# Patient Record
Sex: Male | Born: 1975 | Race: Black or African American | Hispanic: No | Marital: Single | Smoking: Current every day smoker
Health system: Southern US, Community
[De-identification: ages and names within clinical notes are randomized; demographics above are authoritative.]

## PROBLEM LIST (undated history)

## (undated) DIAGNOSIS — B20 Human immunodeficiency virus [HIV] disease: Secondary | ICD-10-CM

---

## 1997-05-05 ENCOUNTER — Encounter: Admission: RE | Admit: 1997-05-05 | Discharge: 1997-05-05 | Payer: Self-pay | Admitting: Internal Medicine

## 1997-05-19 ENCOUNTER — Encounter: Admission: RE | Admit: 1997-05-19 | Discharge: 1997-05-19 | Payer: Self-pay | Admitting: Internal Medicine

## 1997-06-30 ENCOUNTER — Encounter: Admission: RE | Admit: 1997-06-30 | Discharge: 1997-06-30 | Payer: Self-pay | Admitting: *Deleted

## 1997-09-07 ENCOUNTER — Encounter: Admission: RE | Admit: 1997-09-07 | Discharge: 1997-09-07 | Payer: Self-pay | Admitting: Internal Medicine

## 1998-03-01 ENCOUNTER — Ambulatory Visit (HOSPITAL_COMMUNITY): Admission: RE | Admit: 1998-03-01 | Discharge: 1998-03-01 | Payer: Self-pay | Admitting: Internal Medicine

## 1998-03-01 ENCOUNTER — Encounter: Admission: RE | Admit: 1998-03-01 | Discharge: 1998-03-01 | Payer: Self-pay | Admitting: Internal Medicine

## 1999-01-09 ENCOUNTER — Ambulatory Visit (HOSPITAL_COMMUNITY): Admission: RE | Admit: 1999-01-09 | Discharge: 1999-01-09 | Payer: Self-pay | Admitting: Hematology and Oncology

## 1999-09-17 ENCOUNTER — Ambulatory Visit (HOSPITAL_COMMUNITY): Admission: RE | Admit: 1999-09-17 | Discharge: 1999-09-17 | Payer: Self-pay | Admitting: Internal Medicine

## 1999-09-17 ENCOUNTER — Encounter: Admission: RE | Admit: 1999-09-17 | Discharge: 1999-09-17 | Payer: Self-pay | Admitting: Internal Medicine

## 2000-03-17 ENCOUNTER — Encounter: Admission: RE | Admit: 2000-03-17 | Discharge: 2000-03-17 | Payer: Self-pay | Admitting: Internal Medicine

## 2000-03-17 ENCOUNTER — Ambulatory Visit (HOSPITAL_COMMUNITY): Admission: RE | Admit: 2000-03-17 | Discharge: 2000-03-17 | Payer: Self-pay | Admitting: Internal Medicine

## 2000-07-31 ENCOUNTER — Encounter: Admission: RE | Admit: 2000-07-31 | Discharge: 2000-07-31 | Payer: Self-pay | Admitting: Internal Medicine

## 2000-11-05 ENCOUNTER — Ambulatory Visit (HOSPITAL_COMMUNITY): Admission: RE | Admit: 2000-11-05 | Discharge: 2000-11-05 | Payer: Self-pay

## 2000-11-05 ENCOUNTER — Encounter: Admission: RE | Admit: 2000-11-05 | Discharge: 2000-11-05 | Payer: Self-pay

## 2001-06-04 ENCOUNTER — Ambulatory Visit (HOSPITAL_COMMUNITY): Admission: RE | Admit: 2001-06-04 | Discharge: 2001-06-04 | Payer: Self-pay | Admitting: Internal Medicine

## 2001-06-04 ENCOUNTER — Encounter: Admission: RE | Admit: 2001-06-04 | Discharge: 2001-06-04 | Payer: Self-pay | Admitting: Internal Medicine

## 2001-07-08 ENCOUNTER — Encounter: Admission: RE | Admit: 2001-07-08 | Discharge: 2001-07-08 | Payer: Self-pay | Admitting: Internal Medicine

## 2001-08-24 ENCOUNTER — Encounter: Admission: RE | Admit: 2001-08-24 | Discharge: 2001-08-24 | Payer: Self-pay | Admitting: Internal Medicine

## 2001-09-28 ENCOUNTER — Encounter: Admission: RE | Admit: 2001-09-28 | Discharge: 2001-09-28 | Payer: Self-pay | Admitting: Internal Medicine

## 2001-11-03 ENCOUNTER — Encounter: Admission: RE | Admit: 2001-11-03 | Discharge: 2001-11-03 | Payer: Self-pay | Admitting: Internal Medicine

## 2001-11-03 ENCOUNTER — Ambulatory Visit (HOSPITAL_COMMUNITY): Admission: RE | Admit: 2001-11-03 | Discharge: 2001-11-03 | Payer: Self-pay | Admitting: Internal Medicine

## 2001-12-14 ENCOUNTER — Encounter: Admission: RE | Admit: 2001-12-14 | Discharge: 2001-12-14 | Payer: Self-pay | Admitting: Internal Medicine

## 2001-12-17 ENCOUNTER — Encounter: Payer: Self-pay | Admitting: Internal Medicine

## 2001-12-17 ENCOUNTER — Encounter: Admission: RE | Admit: 2001-12-17 | Discharge: 2001-12-17 | Payer: Self-pay | Admitting: Internal Medicine

## 2001-12-17 ENCOUNTER — Ambulatory Visit (HOSPITAL_COMMUNITY): Admission: RE | Admit: 2001-12-17 | Discharge: 2001-12-17 | Payer: Self-pay | Admitting: Internal Medicine

## 2003-05-19 ENCOUNTER — Encounter: Admission: RE | Admit: 2003-05-19 | Discharge: 2003-05-19 | Payer: Self-pay | Admitting: Internal Medicine

## 2003-08-15 ENCOUNTER — Encounter: Admission: RE | Admit: 2003-08-15 | Discharge: 2003-08-15 | Payer: Self-pay | Admitting: Internal Medicine

## 2003-08-15 ENCOUNTER — Ambulatory Visit (HOSPITAL_COMMUNITY): Admission: RE | Admit: 2003-08-15 | Discharge: 2003-08-15 | Payer: Self-pay | Admitting: Internal Medicine

## 2003-11-03 ENCOUNTER — Ambulatory Visit: Payer: Self-pay | Admitting: Internal Medicine

## 2004-02-07 ENCOUNTER — Ambulatory Visit: Payer: Self-pay | Admitting: Internal Medicine

## 2005-05-08 ENCOUNTER — Emergency Department (HOSPITAL_COMMUNITY): Admission: EM | Admit: 2005-05-08 | Discharge: 2005-05-08 | Payer: Self-pay | Admitting: Emergency Medicine

## 2005-06-19 ENCOUNTER — Emergency Department (HOSPITAL_COMMUNITY): Admission: EM | Admit: 2005-06-19 | Discharge: 2005-06-20 | Payer: Self-pay | Admitting: Emergency Medicine

## 2009-01-31 ENCOUNTER — Ambulatory Visit: Payer: Self-pay | Admitting: Internal Medicine

## 2009-01-31 ENCOUNTER — Encounter: Payer: Self-pay | Admitting: Internal Medicine

## 2009-03-01 ENCOUNTER — Ambulatory Visit: Payer: Self-pay | Admitting: Internal Medicine

## 2009-03-02 ENCOUNTER — Encounter (INDEPENDENT_AMBULATORY_CARE_PROVIDER_SITE_OTHER): Payer: Self-pay | Admitting: Internal Medicine

## 2009-03-02 LAB — CONVERTED CEMR LAB
HIV 1 RNA Quant: <48 copies/mL (ref <48)
HIV-1 RNA Quant, Log: <1.68 (ref <1.68)

## 2009-03-06 ENCOUNTER — Ambulatory Visit: Payer: Self-pay | Admitting: Internal Medicine

## 2009-03-06 ENCOUNTER — Encounter: Payer: Self-pay | Admitting: Internal Medicine

## 2009-03-09 ENCOUNTER — Encounter: Payer: Self-pay | Admitting: Internal Medicine

## 2009-03-14 ENCOUNTER — Encounter: Payer: Self-pay | Admitting: Internal Medicine

## 2009-03-15 ENCOUNTER — Encounter: Payer: Self-pay | Admitting: Internal Medicine

## 2009-03-15 ENCOUNTER — Ambulatory Visit: Payer: Self-pay | Admitting: Internal Medicine

## 2009-03-15 DIAGNOSIS — B20 Human immunodeficiency virus [HIV] disease: Secondary | ICD-10-CM

## 2009-03-16 LAB — CONVERTED CEMR LAB
ALT: 48 units/L (ref 0–53)
AST: 45 units/L — ABNORMAL HIGH (ref 0–37)
Absolute CD4: 465 #/uL (ref 381–1469)
Albumin: 4.7 g/dL (ref 3.5–5.2)
Alkaline Phosphatase: 121 units/L — ABNORMAL HIGH (ref 39–117)
BUN: 17 mg/dL (ref 6–23)
Basophils Absolute: 0 10*3/uL (ref 0.0–0.1)
Basophils Relative: 0 % (ref 0–1)
CD4 T Helper %: 31 % — ABNORMAL LOW (ref 32–62)
CO2: 23 meq/L (ref 19–32)
Calcium: 9.8 mg/dL (ref 8.4–10.5)
Chlamydia, Swab/Urine, PCR: NEGATIVE
Chloride: 104 meq/L (ref 96–112)
Creatinine, Ser: 1.01 mg/dL (ref 0.40–1.50)
Eosinophils Absolute: 0.1 10*3/uL (ref 0.0–0.7)
Eosinophils Relative: 3 % (ref 0–5)
GC Probe Amp, Urine: NEGATIVE
Glucose, Bld: 99 mg/dL (ref 70–99)
HCT: 44.4 % (ref 39.0–52.0)
HCV Ab: NEGATIVE
Hemoglobin: 15.1 g/dL (ref 13.0–17.0)
Hep A Total Ab: POSITIVE — AB
Hep B Core Total Ab: NEGATIVE
Hep B S Ab: POSITIVE — AB
Lymphocytes Relative: 30 % (ref 12–46)
Lymphs Abs: 1.5 10*3/uL (ref 0.7–4.0)
MCHC: 34 g/dL (ref 30.0–36.0)
MCV: 92.9 fL (ref 78.0–100.0)
Monocytes Absolute: 0.5 10*3/uL (ref 0.1–1.0)
Monocytes Relative: 10 % (ref 3–12)
Neutro Abs: 2.8 10*3/uL (ref 1.7–7.7)
Neutrophils Relative %: 57 % (ref 43–77)
Platelets: 232 10*3/uL (ref 150–400)
Potassium: 4.5 meq/L (ref 3.5–5.3)
RBC: 4.78 M/uL (ref 4.22–5.81)
RDW: 13.7 % (ref 11.5–15.5)
Sodium: 137 meq/L (ref 135–145)
Total Bilirubin: 0.5 mg/dL (ref 0.3–1.2)
Total Protein: 7.9 g/dL (ref 6.0–8.3)
Total lymphocyte count: 1500 cells/mcL (ref 700–3300)
WBC: 5 10*3/uL (ref 4.0–10.5)

## 2009-07-09 ENCOUNTER — Ambulatory Visit: Payer: Self-pay | Admitting: Internal Medicine

## 2009-07-09 LAB — CONVERTED CEMR LAB
ALT: 74 units/L — ABNORMAL HIGH (ref 0–53)
AST: 52 units/L — ABNORMAL HIGH (ref 0–37)
Albumin: 4.9 g/dL (ref 3.5–5.2)
Alkaline Phosphatase: 113 units/L (ref 39–117)
BUN: 18 mg/dL (ref 6–23)
Basophils Absolute: 0 10*3/uL (ref 0.0–0.1)
Basophils Relative: 0 % (ref 0–1)
CO2: 19 meq/L (ref 19–32)
Calcium: 9.9 mg/dL (ref 8.4–10.5)
Chloride: 107 meq/L (ref 96–112)
Creatinine, Ser: 1.08 mg/dL (ref 0.40–1.50)
Eosinophils Absolute: 0.1 10*3/uL (ref 0.0–0.7)
Eosinophils Relative: 2 % (ref 0–5)
Glucose, Bld: 111 mg/dL — ABNORMAL HIGH (ref 70–99)
HCT: 42.5 % (ref 39.0–52.0)
HIV 1 RNA Quant: <48 copies/mL (ref <48)
HIV-1 RNA Quant, Log: <1.68 (ref <1.68)
Hemoglobin: 15 g/dL (ref 13.0–17.0)
Lymphocytes Relative: 35 % (ref 12–46)
Lymphs Abs: 1.4 10*3/uL (ref 0.7–4.0)
MCHC: 35.3 g/dL (ref 30.0–36.0)
MCV: 93 fL (ref 78.0–100.0)
Monocytes Absolute: 0.4 10*3/uL (ref 0.1–1.0)
Monocytes Relative: 10 % (ref 3–12)
Neutro Abs: 2.1 10*3/uL (ref 1.7–7.7)
Neutrophils Relative %: 53 % (ref 43–77)
Platelets: 241 10*3/uL (ref 150–400)
Potassium: 4.3 meq/L (ref 3.5–5.3)
RBC: 4.57 M/uL (ref 4.22–5.81)
RDW: 12.9 % (ref 11.5–15.5)
Sodium: 141 meq/L (ref 135–145)
Total Bilirubin: 0.4 mg/dL (ref 0.3–1.2)
Total Protein: 8 g/dL (ref 6.0–8.3)
WBC: 4 10*3/uL (ref 4.0–10.5)

## 2009-07-11 ENCOUNTER — Encounter: Payer: Self-pay | Admitting: Internal Medicine

## 2009-07-26 ENCOUNTER — Encounter (INDEPENDENT_AMBULATORY_CARE_PROVIDER_SITE_OTHER): Payer: Self-pay | Admitting: *Deleted

## 2009-07-26 ENCOUNTER — Ambulatory Visit: Payer: Self-pay | Admitting: Internal Medicine

## 2009-08-21 ENCOUNTER — Encounter: Payer: Self-pay | Admitting: Internal Medicine

## 2009-08-21 ENCOUNTER — Emergency Department (HOSPITAL_COMMUNITY): Admission: EM | Admit: 2009-08-21 | Discharge: 2009-08-21 | Payer: Self-pay | Admitting: Family Medicine

## 2009-10-22 ENCOUNTER — Ambulatory Visit: Payer: Self-pay | Admitting: Internal Medicine

## 2009-10-22 LAB — CONVERTED CEMR LAB
ALT: 46 units/L (ref 0–53)
AST: 54 units/L — ABNORMAL HIGH (ref 0–37)
Albumin: 4.4 g/dL (ref 3.5–5.2)
Alkaline Phosphatase: 133 units/L — ABNORMAL HIGH (ref 39–117)
BUN: 18 mg/dL (ref 6–23)
Basophils Absolute: 0 10*3/uL (ref 0.0–0.1)
Basophils Relative: 0 % (ref 0–1)
CO2: 21 meq/L (ref 19–32)
Calcium: 9.6 mg/dL (ref 8.4–10.5)
Chloride: 106 meq/L (ref 96–112)
Cholesterol: 158 mg/dL (ref 0–200)
Creatinine, Ser: 0.97 mg/dL (ref 0.40–1.50)
Eosinophils Absolute: 0.1 10*3/uL (ref 0.0–0.7)
Eosinophils Relative: 3 % (ref 0–5)
Glucose, Bld: 100 mg/dL — ABNORMAL HIGH (ref 70–99)
HCT: 42.7 % (ref 39.0–52.0)
HDL: 45 mg/dL (ref >39)
HIV 1 RNA Quant: <20 copies/mL (ref <20)
HIV-1 RNA Quant, Log: <1.3 (ref <1.30)
Hemoglobin: 14.5 g/dL (ref 13.0–17.0)
LDL Cholesterol: 74 mg/dL (ref 0–99)
Lymphocytes Relative: 44 % (ref 12–46)
Lymphs Abs: 1.7 10*3/uL (ref 0.7–4.0)
MCHC: 34 g/dL (ref 30.0–36.0)
MCV: 96 fL (ref 78.0–100.0)
Monocytes Absolute: 0.4 10*3/uL (ref 0.1–1.0)
Monocytes Relative: 10 % (ref 3–12)
Neutro Abs: 1.6 10*3/uL — ABNORMAL LOW (ref 1.7–7.7)
Neutrophils Relative %: 43 % (ref 43–77)
Platelets: 283 10*3/uL (ref 150–400)
Potassium: 4.4 meq/L (ref 3.5–5.3)
RBC: 4.45 M/uL (ref 4.22–5.81)
RDW: 13.3 % (ref 11.5–15.5)
Sodium: 136 meq/L (ref 135–145)
Total Bilirubin: 0.3 mg/dL (ref 0.3–1.2)
Total CHOL/HDL Ratio: 3.5
Total Protein: 7.3 g/dL (ref 6.0–8.3)
Triglycerides: 197 mg/dL — ABNORMAL HIGH (ref <150)
VLDL: 39 mg/dL (ref 0–40)
WBC: 3.8 10*3/uL — ABNORMAL LOW (ref 4.0–10.5)

## 2009-11-07 ENCOUNTER — Ambulatory Visit: Payer: Self-pay | Admitting: Internal Medicine

## 2009-11-07 DIAGNOSIS — M25569 Pain in unspecified knee: Secondary | ICD-10-CM

## 2010-02-12 NOTE — Miscellaneous (Signed)
Summary: HIPAA Restrictions  HIPAA Restrictions   Imported By: Florinda Marker 07/11/2009 11:21:04  _____________________________________________________________________  External Attachment:    Type:   Image     Comment:   External Document

## 2010-02-12 NOTE — Miscellaneous (Signed)
Summary: RW Update  Clinical Lists Changes  Observations: Added new observation of RWPARTICIP: Yes (08/21/2009 13:26)

## 2010-02-12 NOTE — Letter (Signed)
Summary: Past Medical Records  Past Medical Records   Imported By: Percell Miller 03/09/2009 15:10:26  _____________________________________________________________________  External Attachment:    Type:   Image     Comment:   External Document

## 2010-02-12 NOTE — Miscellaneous (Signed)
Summary: intake  Clinical Lists Changes  Medications: Added new medication of * ATRIPLA 600-300-200 MG Take one tablet by mouth daily. Observations: Added new observation of TOXO_PMH_YR: No (01/31/2009 15:23) Added new observation of PCP_PMH_YEAR: No (01/31/2009 15:23) Added new observation of MAC_PMH_YEAR: No (01/31/2009 15:23) Added new observation of HERPES_POS: No (01/31/2009 15:23) Added new observation of CMV_PMH_YEAR: No (01/31/2009 15:23) Added new observation of DRUG USE: No (01/31/2009 15:23) Added new observation of ALCOHOL USE: 2x week (01/31/2009 15:23) Added new observation of ALCOHOL COMM: Yes (01/31/2009 15:23) Added new observation of SMOK HX PPD: 1/2 (01/31/2009 15:23) Added new observation of PAS CIG SMOK: Yes (01/31/2009 15:23) Added new observation of TOBACCO USE: current (01/31/2009 15:23) Added new observation of RWTITLE: B (01/31/2009 15:23) Added new observation of PAYOR: Unknown (01/31/2009 15:23) Added new observation of AIDSDAP: No (01/31/2009 15:23) Added new observation of HOUSEINCOME: 0  (01/31/2009 15:23) Added new observation of HOUSING: Temporary  (01/31/2009 15:23) Added new observation of #CHILD<18 IN: No  (01/31/2009 15:23) Added new observation of FAMILYSIZE: 1  (01/31/2009 15:23) Added new observation of YEARLYEXPEN: 0  (01/31/2009 15:23) Added new observation of HIV RISK BEH: Heterosexual contact  (01/31/2009 15:23) Added new observation of MARITAL STAT: Single  (01/31/2009 15:23) Added new observation of INFECTDIS MD: Philipp Deputy  (01/31/2009 15:23) Added new observation of HIV INIT DX: 1996  (01/31/2009 15:23) Added new observation of GENDER: Male  (01/31/2009 15:23) Added new observation of LATINO/HISP: No  (01/31/2009 15:23) Added new observation of RACE: African American  (01/31/2009 15:23) Added new observation of REC_MESSAGE: Yes  (01/31/2009 15:23) Added new observation of RECPHONECALL: Yes  (01/31/2009 15:23) Added new observation of  REC_MAIL: Yes  (01/31/2009 15:23) Added new observation of RW VITAL STA: Active  (01/31/2009 15:23) Added new observation of PATNTCOUNTY: [place]  (01/31/2009 15:23) Added new observation of RWPARTICIP: Yes - [facility]  (01/31/2009 15:23) Added new observation of FLU VAX: Historical  (01/01/2009 16:44) Added new observation of HEPBVAX#2: Historical  (10/10/2008 16:44) Added new observation of HEPBVAX#1: Historical  (09/01/2008 16:44) Added new observation of PNEUMOVAX: Historical  (08/09/2008 16:44) Added new observation of TD BOOSTER: Historical  (06/29/2008 16:44)       Infectious Disease New Patient Intake Referring MD: [facility]  Return Appointment With Physician: Dr.Willford Rabideau Medical Records: Received Health Insurance / Payor: Unknown Employer: none      Do you have a Primary physician: No Are family members aware of patient's diagnosis?  If so, are they supportive? mother aware and supportive Describe patient's current social support (family, friends, support groups): has a girlfrend of 18 years  Medical History Medical Problems OTHER than HIV: Yes  Medication Allergies: No   Medications: Yes   Current Meds:  * ATRIPLA 600-300-200 MG Take one tablet by mouth daily.   Family History Heart Disease: Yes  Comments: mother MI, stent Hypertension: Yes  Comments: mother Diabetes: No High Cholesterol: No Kidney Disease: No Cancer: Yes  Comments: mother lung Cancer Thyroid Disease: No  Tobacco use: current Amt: 1/2 packs per day. Passive cigarette smoke exposure: Yes  Behavioral Health Assessment Have you ever been diagnosed with depression or mental illness? No  Do you drink alcohol? Yes Last Date of Consumption: 01/30/2009 Frequency: 2x week Alcohol Beverage Type(s):  beer 40 oz. Do you use recreational drugs? 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 No   Facility Name 1: [facility]    City/State: [place],  [facility] Comments: 3 at [facility] for court ordered tx for cocaine  HIV Intake Information When did you first test positive for HIV? 1996 Where was this test performed?  Name of Agency: [facility]  City/State: [place], [place]  [place]: [place] Was this your first time ever being tested or HIV? Yes Risk Factor(s) for HIV: Heterosexual contact  Method of Exposure to HIV: Heterosexual Intercourse Have you ever been hospitalized for any HIV-related condition? No  Have you ever been under the care of a physician for being HIV positive? Yes Name of Physician 1: [facility]  City/State: [place], [place]  HIV Medications Information  Nucleoside Reverse Transcriptase Inhibitors (NRTI's) Combivir (Lamivadin 150mg /Zidovudine 300mg ): Yes   Last Date of Use:  2003 or 2004  Non-Nucleoside Reverse Transcriptase Inhibitors (NNRTI's) Sustiva (Efavirenz): Yes   Last Date of Use:  2003 or 2004  Infection History  Patient has been diagnosed with the following opportunistic infections: Candidiasis (thrush) No Herpes No Mycobacterium avium (MAI or MAC) No Cervical Cancer No Histoplasmosis No Pneumocystis carinii pneumonia (PCP) No Coccidiodomycosis No Isosporiasis No Progressive Multifocal Leukoencephalophthy (PML) No Cryptococcosis No Kaposi's Sarcoma No Pneumonia, Recurrent No Crytposporidiosis No Lymphoma No Salmonella Septicemia, Recurrent No Cytomegalovirus (CMV) No Tuberculosis (TB) No Toxoplasmosis No Encephalopathy No Wasting Syndrome No Are there any other symptoms you need to discuss? No Have you received literature/education prior to this visit about HIV/AIDS? Yes Do you understand the meaning of a Viral Load? Yes Do you understand the meaning of a CD4 count? Yes Lab Values Education/Handout Given No Medication Education/Handout Given No  Sexual History Are you in a current relationship? Yes How  long have you been in this relationship? 18 years Are they aware of your diagnosis? Yes Have they been tested for HIV? Yes What were the results: Negative Are you currently sexually active? Yes Was this protected intercourse? Yes  Evaluation and Follow-Up  Juanell Fairly Consent: Yes ADAP: No Are you in need of condoms at this time? No Our patient has been informed that condoms are always available in this clinic.   Brochure Provided for Above Organizations? Yes Name of Agency: [facility] Does patient have problems that warrant Social Worker referral? No    Hepatitis B Immunization History:    Hep B # 1:  Historical (09/01/2008)    Hep B # 2:  Historical (10/10/2008)  Tetanus/Td Immunization History:    Tetanus/Td # 1:  Historical (06/29/2008)  Influenza Immunization History:    Influenza # 1:  Historical (01/01/2009)  Pneumovax Immunization History:    Pneumovax # 1:  Historical (08/09/2008)

## 2010-02-12 NOTE — Miscellaneous (Signed)
Summary: Orders Update  Clinical Lists Changes  Orders: Added new Test order of T-CBC w/Diff [phone]) - Signed Added new Test order of T-CD4SP [facility]) (CD4SP) - Signed Added new Test order of T-Comprehensive Metabolic Panel [phone]) - Signed Added new Test order of T-HIV Viral Load [phone]) - Signed     Process Orders Check Orders Results:     [facility]: ABN not required for this insurance Tests Sent for requisitioning (July 09, 2009 2:46 PM):     07/09/2009: [facility] -- T-CBC w/Diff [[id number]] (signed)     07/09/2009: [facility] -- T-Comprehensive Metabolic Panel [[id number]] (signed)     07/09/2009: [facility] -- T-HIV Viral Load [phone] (signed)

## 2010-02-12 NOTE — Miscellaneous (Signed)
Summary: clinical update/ryan white  Clinical Lists Changes  Observations: Added new observation of AIDSDAP: Yes 2011 (07/26/2009 15:18) Added new observation of HOUSING: Unstable (07/26/2009 15:18) Added new observation of INCOMESOURCE: NONE (07/26/2009 15:18) Added new observation of FINASSESSDT: 07/26/2009 (07/26/2009 15:18)

## 2010-02-12 NOTE — Letter (Signed)
Summary: VERIFICATION OF DISABILTY/ HANDICAP  VERIFICATION OF DISABILTY/ HANDICAP   Imported By: Margie Billet 04/05/2009 11:36:45  _____________________________________________________________________  External Attachment:    Type:   Image     Comment:   External Document

## 2010-02-12 NOTE — Assessment & Plan Note (Signed)
Summary: F/U [MKJ]   CC:  follow-up visit and lab results.  History of Present Illness: Pt here for f/u.  He finally received his food stamps and is going to get housing. He missed o 1 or 2 doses of his Atripla.  He has been feeling well.  Preventive Screening-Counseling & Management  Alcohol-Tobacco     Alcohol drinks/day: 2x week     Smoking Status: current     Packs/Day: 1.0     Passive Smoke Exposure: Yes  Caffeine-Diet-Exercise     Caffeine use/day: 0     Does Patient Exercise: no  Safety-Violence-Falls     Seat Belt Use: yes      Sexual History:  currently monogamous.        Drug Use:  No.    Comments: pt. declined condoms   Updated Prior Medication List: ATRIPLA 600-200-300 MG TABS (EFAVIRENZ-EMTRICITAB-TENOFOVIR)   Current Allergies (reviewed today): No known allergies  Past History:  Past Medical History: Last updated: 03/15/2009 HIV disease  Social History: Sexual History:  currently monogamous  Review of Systems  The patient denies anorexia, fever, and weight loss.    Vital Signs:  Patient profile:   35 year old male Height:      67 inches (170.18 cm) Weight:      176.8 pounds (80.36 kg) BMI:     27.79 Temp:     97.8 degrees F (36.56 degrees C) oral Pulse rate:   92 / minute BP sitting:   138 / 91  (left arm)  Vitals Entered By: Wendall Mola CMA Duncan Dull) (July 26, 2009 2:31 PM) CC: follow-up visit, lab results Is Patient Diabetic? No Pain Assessment Patient in pain? no      Nutritional Status BMI of 25 - 29 = overweight Nutritional Status Detail appetite "good"  Have you ever been in a relationship where you felt threatened, hurt or afraid?No   Does patient need assistance? Functional Status Self care Ambulation Normal Comments no missed doses of meds per patient   Physical Exam  General:  alert, well-developed, well-nourished, and well-hydrated.   Head:  normocephalic and atraumatic.   Mouth:  pharynx pink and moist.   no thrush  Lungs:  normal breath sounds.      Impression & Recommendations:  Problem # 1:  HIV DISEASE (ICD-042) Pt.s most recent CD4ct was 460 and VL <48 .  Pt instructed to continue the current antiretroviral regimen.  Pt encouraged to take medication regularly and not miss doses.  Pt will f/u in 3 months for repeat blood work and will see me 2 weeks later.  Diagnostics Reviewed:  HIV: HIV positive - AIDS status unknown (03/15/2009)   CD4: 460 (07/10/2009)   WBC: 4.0 (07/09/2009)   Hgb: 15.0 (07/09/2009)   HCT: 42.5 (07/09/2009)   Platelets: 241 (07/09/2009) HIV-1 RNA: <48 copies/mL (07/09/2009)     Other Orders: Est. Patient Level III (16109) Future Orders: T-CD4SP ([facility]) (CD4SP) ... 10/24/2009 T-HIV Viral Load [phone]) ... 10/24/2009 T-Comprehensive Metabolic Panel [id number]) ... 10/24/2009 T-CBC w/Diff ([id number]) ... 10/24/2009 T-Lipid Profile [id number]) ... 10/24/2009  Patient Instructions: 1)  Please schedule a follow-up appointment in 3 months, 2 weeks after labs.

## 2010-02-12 NOTE — Miscellaneous (Signed)
Summary: Office Visit ([facility])    Vital Signs:  Patient profile:   35 year old male Height:      67 inches Weight:      179.1 pounds BMI:     28.15 Temp:     98.5 degrees F oral Pulse rate:   91 / minute Pulse rhythm:   regular Resp:     18 per minute BP sitting:   113 / 89  (left arm) Cuff size:   regular  Vitals Entered By: Michelle Nasuti (March 15, 2009 11:02 AM) CC: new 05 visit establish care Is Patient Diabetic? No Pain Assessment Patient in pain? no       Does patient need assistance? Functional Status Self care Ambulation Normal   CC:  new 05 visit establish care.  History of Present Illness: Pt doing well.  He was recently released from prison. He is currently taking Atripla without any problems. His last CD4ct in prison was in the 300s.  Preventive Screening-Counseling & Management  Alcohol-Tobacco     Smoking Status: current     Packs/Day: 0.5  Current Problems (verified): 1)  HIV Disease  (ICD-042)  Current Medications (verified): 1)  Atripla 600-200-300 Mg Tabs (Efavirenz-Emtricitab-Tenofovir)  Allergies (verified): No Known Drug Allergies  Past History:  Past Medical History: HIV disease   Review of Systems  The patient denies anorexia, fever, and weight loss.     Physical Exam  General:  alert, well-developed, well-nourished, and well-hydrated.   Head:  normocephalic and atraumatic.   Mouth:  pharynx pink and moist.   Lungs:  normal breath sounds.          Medication Adherence: 03/15/2009   Adherence to medications reviewed with patient. Counseling to provide adequate adherence provided   Prevention For Positives: 03/15/2009   Safe sex practices discussed with patient. Condoms offered.                             Impression & Recommendations:  Problem # 1:  HIV DISEASE (ICD-042)  Last CD4ct was 465 and VL <48.  He is to continue his Atripla and try not to miss any doses. Diagnostics Reviewed:  WBC: 5.0  (03/06/2009)   Hgb: 15.1 (03/06/2009)   HCT: 44.4 (03/06/2009)   Platelets: 232 (03/06/2009) HIV-1 RNA: <48 copies/mL (03/02/2009)     Orders: New Patient Level III (16109)  Medications Added to Medication List This Visit: 1)  Atripla 600-200-300 Mg Tabs (Efavirenz-emtricitab-tenofovir)   Patient Instructions: 1)  Please schedule a follow-up appointment in 3 months. Prescriptions: ATRIPLA 600-200-300 MG TABS (EFAVIRENZ-EMTRICITAB-TENOFOVIR)   #30 x 5   Entered and Authorized by:   Yisroel Ramming MD   Signed by:   Yisroel Ramming MD on 03/15/2009   Method used:   Print then Give to Patient   RxID:   [id number]

## 2010-02-12 NOTE — Assessment & Plan Note (Signed)
Summary: F/U/VS   CC:  follow-up visit, lab results, and c/o right knee pain.  History of Present Illness: Pt here for lab results. He c/o some right knee pain. He thinks it may be due to riding his bike.  He has not tried anything for the pain. No missed doses of his Atripla.  Preventive Screening-Counseling & Management  Alcohol-Tobacco     Alcohol drinks/day: 2x week     Smoking Status: current     Packs/Day: 1.0     Passive Smoke Exposure: Yes  Caffeine-Diet-Exercise     Caffeine use/day: 0     Does Patient Exercise: yes     Type of exercise: biking     Times/week: 7  Safety-Violence-Falls     Seat Belt Use: yes      Sexual History:  currently monogamous.        Drug Use:  former and cocaine.    Comments: pt. declined condoms   Updated Prior Medication List: ATRIPLA 600-200-300 MG TABS (EFAVIRENZ-EMTRICITAB-TENOFOVIR) 1 tablet daily by mouth  Current Allergies (reviewed today): No known allergies  Past History:  Past Medical History: Last updated: 03/15/2009 HIV disease  Social History: Drug Use:  former, cocaine  Review of Systems  The patient denies anorexia, fever, and weight loss.    Vital Signs:  Patient profile:   35 year old male Height:      67 inches (170.18 cm) Weight:      166.8 pounds (75.82 kg) BMI:     26.22 Temp:     98.5 degrees F (36.94 degrees C) oral Pulse rate:   60 / minute BP sitting:   121 / 78  (left arm)  Vitals Entered By: Wendall Mola CMA Duncan Dull) (November 07, 2009 3:09 PM) CC: follow-up visit, lab results, c/o right knee pain Is Patient Diabetic? No Pain Assessment Patient in pain? yes     Location: right knee Intensity: 8 Type: aching Onset of pain  Intermittent Nutritional Status BMI of 25 - 29 = overweight Nutritional Status Detail appetite "good"  Have you ever been in a relationship where you felt threatened, hurt or afraid?No   Does patient need assistance? Functional Status Self  care Ambulation Normal Comments pt. missed 2-3 doses of Atripla since last visit   Physical Exam  General:  alert, well-developed, well-nourished, and well-hydrated.   Head:  normocephalic and atraumatic.   Mouth:  pharynx pink and moist.   Lungs:  normal breath sounds.   Msk:  no joint tenderness, no joint swelling, no joint warmth, and no redness over joints.     Impression & Recommendations:  Problem # 1:  HIV DISEASE (ICD-042) Pt.s most recent CD4ct was 570 and VL <20 .  Pt instructed to continue the current antiretroviral regimen.  Pt encouraged to take medication regularly and not miss doses.  Pt will f/u in 3 months for repeat blood work and will see me 2 weeks later. Influenza vaccine given.  Diagnostics Reviewed:  HIV: HIV positive - AIDS status unknown (03/15/2009)   CD4: 570 (10/23/2009)   WBC: 3.8 (10/22/2009)   Hgb: 14.5 (10/22/2009)   HCT: 42.7 (10/22/2009)   Platelets: 283 (10/22/2009) HIV-1 RNA: <20 copies/mL (10/22/2009)     Problem # 2:  KNEE PAIN, RIGHT (ICD-719.46) ibuprfoen three times a day with food  Other Orders: Est. Patient Level III (16967) Influenza Vaccine NON MCR (89381) TB Skin Test (01751) Admin 1st Vaccine (02585) Future Orders: T-CD4SP ([facility]) (CD4SP) ... 02/05/2010 T-HIV  Viral Load [id number]) ... 02/05/2010 T-Comprehensive Metabolic Panel [phone]) ... 02/05/2010 T-CBC w/Diff ([id number]) ... 02/05/2010  Patient Instructions: 1)  Please schedule a follow-up appointment in 3 months, 2 weeks after labs.      Immunizations Administered:  Influenza Vaccine # 1:    Vaccine Type: Fluvax Non-MCR    Site: left deltoid    Mfr: Novartis    Dose: 0.5 ml    Route: IM    Given by: Wendall Mola CMA ( AAMA)    Exp. Date: 04/14/2010    Lot #: [id number]    VIS given: 08/07/09 version given November 07, 2009.  PPD Skin Test:    Vaccine Type: PPD    Site: left forearm    Mfr: Sanofi Pasteur    Dose: 0.1 ml    Route: ID     Given by: Wendall Mola CMA ( AAMA)    Exp. Date: 11/15/2010    Lot #: [id number]  Flu Vaccine Consent Questions:    Do you have a history of severe allergic reactions to this vaccine? no    Any prior history of allergic reactions to egg and/or gelatin? no    Do you have a sensitivity to the preservative Thimersol? no    Do you have a past history of Guillan-Barre Syndrome? no    Do you currently have an acute febrile illness? no    Have you ever had a severe reaction to latex? no    Vaccine information given and explained to patient? yes

## 2010-02-18 ENCOUNTER — Other Ambulatory Visit: Payer: Self-pay

## 2010-02-21 ENCOUNTER — Encounter (INDEPENDENT_AMBULATORY_CARE_PROVIDER_SITE_OTHER): Payer: Self-pay | Admitting: *Deleted

## 2010-02-28 ENCOUNTER — Telehealth (INDEPENDENT_AMBULATORY_CARE_PROVIDER_SITE_OTHER): Payer: Self-pay | Admitting: *Deleted

## 2010-02-28 ENCOUNTER — Other Ambulatory Visit: Payer: Self-pay | Admitting: Adult Health

## 2010-02-28 ENCOUNTER — Other Ambulatory Visit (INDEPENDENT_AMBULATORY_CARE_PROVIDER_SITE_OTHER): Payer: Self-pay

## 2010-02-28 ENCOUNTER — Encounter: Payer: Self-pay | Admitting: Adult Health

## 2010-02-28 DIAGNOSIS — B20 Human immunodeficiency virus [HIV] disease: Secondary | ICD-10-CM

## 2010-02-28 LAB — CONVERTED CEMR LAB
ALT: 25 U/L
AST: 31 U/L
Albumin: 4 g/dL
Alkaline Phosphatase: 111 U/L
BUN: 14 mg/dL
Basophils Absolute: 0 K/uL
Basophils Relative: 1 %
CO2: 24 meq/L
Calcium: 9.4 mg/dL
Chloride: 109 meq/L
Creatinine, Ser: 0.88 mg/dL
Eosinophils Absolute: 0.2 K/uL
Eosinophils Relative: 4 %
Glucose, Bld: 97 mg/dL
HCT: 41.5 %
HIV 1 RNA Quant: <20 copies/mL (ref <20)
HIV-1 RNA Quant, Log: <1.3 (ref <1.30)
Hemoglobin: 14.1 g/dL
Lymphocytes Relative: 45 %
Lymphs Abs: 1.9 K/uL
MCHC: 34 g/dL
MCV: 95.2 fL
Monocytes Absolute: 0.4 K/uL
Monocytes Relative: 9 %
Neutro Abs: 1.7 K/uL
Neutrophils Relative %: 42 % — ABNORMAL LOW
Platelets: 257 K/uL
Potassium: 4.5 meq/L
RBC: 4.36 M/uL
RDW: 12.9 %
Sodium: 141 meq/L
Total Bilirubin: 0.2 mg/dL — ABNORMAL LOW
Total Protein: 6.7 g/dL
WBC: 4.1 10*3/microliter

## 2010-02-28 NOTE — Miscellaneous (Signed)
  Clinical Lists Changes  Observations: Added new observation of PAYOR: No Insurance (02/21/2010 13:58)

## 2010-03-01 LAB — T-HELPER CELL (CD4) - (RCID CLINIC ONLY)
CD4 % Helper T Cell: 34 % (ref 33–55)
CD4 T Cell Abs: 630 uL (ref 400–2700)

## 2010-03-04 ENCOUNTER — Ambulatory Visit: Payer: Self-pay | Admitting: Adult Health

## 2010-03-05 ENCOUNTER — Encounter (INDEPENDENT_AMBULATORY_CARE_PROVIDER_SITE_OTHER): Payer: Self-pay | Admitting: *Deleted

## 2010-03-06 NOTE — Progress Notes (Signed)
Summary: ADAP re-enrollment submitted  Phone Note Call from Patient   Summary of Call: ADAP re-enrollment completed and submitted by Lonell Face on 02/25/2010

## 2010-03-06 NOTE — Progress Notes (Signed)
Summary: ADAP re-enrollment submitted  Phone Note Call from Patient   Summary of Call: ADAP re-enrollment completed and submitted by Lonell Face on 02/27/2010

## 2010-03-08 ENCOUNTER — Encounter (INDEPENDENT_AMBULATORY_CARE_PROVIDER_SITE_OTHER): Payer: Self-pay | Admitting: *Deleted

## 2010-03-12 NOTE — Miscellaneous (Signed)
  Clinical Lists Changes  Observations: Added new observation of HIV STATUS: HIV positive - not AIDS (03/05/2010 16:00) 

## 2010-03-12 NOTE — Miscellaneous (Signed)
  Clinical Lists Changes  Observations: Added new observation of HOUSING: nonpermanent (01/12/2010 10:47)

## 2010-03-14 ENCOUNTER — Ambulatory Visit: Payer: Self-pay | Admitting: Adult Health

## 2010-03-21 ENCOUNTER — Encounter (INDEPENDENT_AMBULATORY_CARE_PROVIDER_SITE_OTHER): Payer: Self-pay | Admitting: *Deleted

## 2010-03-26 NOTE — Miscellaneous (Signed)
Summary: adap update   Clinical Lists Changes  Observations: Added new observation of FINASSESSDT: 03/08/2010 (03/21/2010 13:17) Added new observation of AIDSDAP: Yes 2012 (03/21/2010 13:17)

## 2010-03-28 LAB — T-HELPER CELL (CD4) - (RCID CLINIC ONLY)
CD4 % Helper T Cell: 33 % (ref 33–55)
CD4 T Cell Abs: 570 uL (ref 400–2700)

## 2010-03-29 ENCOUNTER — Ambulatory Visit: Payer: Self-pay | Admitting: Adult Health

## 2010-05-08 ENCOUNTER — Other Ambulatory Visit: Payer: Self-pay | Admitting: Licensed Clinical Social Worker

## 2010-05-08 DIAGNOSIS — B20 Human immunodeficiency virus [HIV] disease: Secondary | ICD-10-CM

## 2010-05-08 MED ORDER — EFAVIRENZ-EMTRICITAB-TENOFOVIR 600-200-300 MG PO TABS: 1.0000 | ORAL_TABLET | Freq: Every day | ORAL | Status: DC

## 2010-06-03 ENCOUNTER — Other Ambulatory Visit (INDEPENDENT_AMBULATORY_CARE_PROVIDER_SITE_OTHER): Payer: Self-pay

## 2010-06-03 DIAGNOSIS — B20 Human immunodeficiency virus [HIV] disease: Secondary | ICD-10-CM

## 2010-06-03 LAB — CBC WITH DIFFERENTIAL/PLATELET
Basophils Absolute: 0 10*3/uL (ref 0.0–0.1)
Basophils Relative: 1 % (ref 0–1)
Eosinophils Absolute: 0.1 10*3/uL (ref 0.0–0.7)
Eosinophils Relative: 3 % (ref 0–5)
HCT: 45 % (ref 39.0–52.0)
Lymphocytes Relative: 34 % (ref 12–46)
MCH: 33.2 pg (ref 26.0–34.0)
MCHC: 34.4 g/dL (ref 30.0–36.0)
MCV: 96.4 fL (ref 78.0–100.0)
Monocytes Absolute: 0.3 10*3/uL (ref 0.1–1.0)
Platelets: 259 10*3/uL (ref 150–400)
RDW: 12.9 % (ref 11.5–15.5)

## 2010-06-04 LAB — COMPLETE METABOLIC PANEL WITH GFR
AST: 22 U/L (ref 0–37)
BUN: 14 mg/dL (ref 6–23)
Calcium: 9.6 mg/dL (ref 8.4–10.5)
Chloride: 103 mEq/L (ref 96–112)
Creat: 0.99 mg/dL (ref 0.40–1.50)
Total Bilirubin: 0.6 mg/dL (ref 0.3–1.2)

## 2010-06-04 LAB — LIPID PANEL
Cholesterol: 165 mg/dL (ref 0–200)
HDL: 73 mg/dL (ref >39)
LDL Cholesterol: 72 mg/dL (ref 0–99)
Total CHOL/HDL Ratio: 2.3 Ratio
Triglycerides: 99 mg/dL (ref <150)
VLDL: 20 mg/dL (ref 0–40)

## 2010-06-04 LAB — T-HELPER CELL (CD4) - (RCID CLINIC ONLY)
CD4 % Helper T Cell: 33 % (ref 33–55)
CD4 T Cell Abs: 470 uL (ref 400–2700)

## 2010-06-05 LAB — HIV-1 RNA QUANT-NO REFLEX-BLD: HIV 1 RNA Quant: <20 copies/mL (ref <20)

## 2010-06-17 ENCOUNTER — Ambulatory Visit (INDEPENDENT_AMBULATORY_CARE_PROVIDER_SITE_OTHER): Payer: Self-pay | Admitting: Adult Health

## 2010-06-17 ENCOUNTER — Encounter: Payer: Self-pay | Admitting: Adult Health

## 2010-06-17 DIAGNOSIS — Z113 Encounter for screening for infections with a predominantly sexual mode of transmission: Secondary | ICD-10-CM

## 2010-06-17 DIAGNOSIS — Z79899 Other long term (current) drug therapy: Secondary | ICD-10-CM

## 2010-06-17 DIAGNOSIS — Z21 Asymptomatic human immunodeficiency virus [HIV] infection status: Secondary | ICD-10-CM

## 2010-06-17 DIAGNOSIS — B20 Human immunodeficiency virus [HIV] disease: Secondary | ICD-10-CM

## 2010-06-17 NOTE — Progress Notes (Signed)
  Subjective:    Patient ID: Erik Espinoza, male    DOB: [birth year], 35 y.o.   MRN: [id number]  HPI Erik Espinoza presents to clinic today for routine scheduled followup. Endorses adherence to his antiretrovirals with good tolerance and no complications. Voices no physical complaints today and states has been feeling in his normal state of good health.   Review of Systems  Constitutional: Negative.   HENT: Negative.   Eyes: Negative.   Respiratory: Negative.   Cardiovascular: Negative.   Gastrointestinal: Negative.   Genitourinary: Negative.   Musculoskeletal: Negative.   Skin: Negative.   Neurological: Negative.   Hematological: Negative.   Psychiatric/Behavioral: Negative.        Objective:   Physical Exam  Constitutional: He is oriented to person, place, and time. He appears well-developed and well-nourished.  HENT:  Head: Normocephalic and atraumatic.  Right Ear: External ear normal.  Left Ear: External ear normal.  Mouth/Throat: Oropharynx is clear and moist.  Eyes: Conjunctivae and EOM are normal. Pupils are equal, round, and reactive to light.  Neck: Normal range of motion. Neck supple.  Cardiovascular: Normal rate, regular rhythm and normal heart sounds.   Pulmonary/Chest: Effort normal and breath sounds normal.  Abdominal: Soft. Bowel sounds are normal.  Musculoskeletal: Normal range of motion.  Neurological: He is alert and oriented to person, place, and time. No cranial nerve deficit. He exhibits normal muscle tone. Coordination normal.  Skin: Skin is warm. No rash noted.  Psychiatric: He has a normal mood and affect. His behavior is normal. Judgment and thought content normal.          Assessment & Plan:  1. HIV. Labs obtained 06/03/2010 show a CD4 count of 470 at 33% with a viral load less than 20 copies/mL. While his CD4 count is somewhat lower than normal. His percents only show a 1% variance, which means that you could be nothing more than diurinal variant.  Recommend continuing present management, following up in 4 months with repeat labs 2 weeks before next appointment. He verbally acknowledged all information that was provided to him and agreed with plan of care.

## 2010-08-09 ENCOUNTER — Emergency Department (HOSPITAL_COMMUNITY)
Admission: EM | Admit: 2010-08-09 | Discharge: 2010-08-09 | Disposition: A | Discharge status: Home / Self Care | Payer: Self-pay | Attending: Emergency Medicine | Admitting: Emergency Medicine

## 2010-08-09 ENCOUNTER — Emergency Department (HOSPITAL_COMMUNITY): Payer: Self-pay

## 2010-08-09 DIAGNOSIS — Z1810 Retained metal fragments, unspecified: Secondary | ICD-10-CM | POA: Insufficient documentation

## 2010-08-09 DIAGNOSIS — Z21 Asymptomatic human immunodeficiency virus [HIV] infection status: Secondary | ICD-10-CM | POA: Insufficient documentation

## 2010-08-09 DIAGNOSIS — S022XXA Fracture of nasal bones, initial encounter for closed fracture: Secondary | ICD-10-CM | POA: Insufficient documentation

## 2010-08-09 DIAGNOSIS — S41109A Unspecified open wound of unspecified upper arm, initial encounter: Secondary | ICD-10-CM | POA: Insufficient documentation

## 2010-08-09 DIAGNOSIS — F17200 Nicotine dependence, unspecified, uncomplicated: Secondary | ICD-10-CM | POA: Insufficient documentation

## 2010-10-25 ENCOUNTER — Other Ambulatory Visit: Payer: Self-pay

## 2010-10-31 ENCOUNTER — Other Ambulatory Visit (INDEPENDENT_AMBULATORY_CARE_PROVIDER_SITE_OTHER): Payer: Self-pay

## 2010-10-31 DIAGNOSIS — B20 Human immunodeficiency virus [HIV] disease: Secondary | ICD-10-CM

## 2010-10-31 DIAGNOSIS — Z79899 Other long term (current) drug therapy: Secondary | ICD-10-CM

## 2010-10-31 DIAGNOSIS — Z113 Encounter for screening for infections with a predominantly sexual mode of transmission: Secondary | ICD-10-CM

## 2010-10-31 DIAGNOSIS — Z21 Asymptomatic human immunodeficiency virus [HIV] infection status: Secondary | ICD-10-CM

## 2010-10-31 LAB — URINALYSIS
Bilirubin Urine: NEGATIVE
Glucose, UA: NEGATIVE mg/dL
Hgb urine dipstick: NEGATIVE
Protein, ur: NEGATIVE mg/dL

## 2010-11-01 LAB — CBC WITH DIFFERENTIAL/PLATELET
Basophils Relative: 1 % (ref 0–1)
Eosinophils Absolute: 0.1 10*3/uL (ref 0.0–0.7)
Eosinophils Relative: 2 % (ref 0–5)
Lymphs Abs: 1.4 10*3/uL (ref 0.7–4.0)
MCH: 33.9 pg (ref 26.0–34.0)
MCHC: 34.7 g/dL (ref 30.0–36.0)
MCV: 97.7 fL (ref 78.0–100.0)
Platelets: 285 10*3/uL (ref 150–400)
RBC: 4.36 MIL/uL (ref 4.22–5.81)

## 2010-11-01 LAB — RPR

## 2010-11-01 LAB — COMPLETE METABOLIC PANEL WITH GFR
ALT: 49 U/L (ref 0–53)
CO2: 22 mEq/L (ref 19–32)
Creat: 0.98 mg/dL (ref 0.50–1.35)
GFR, Est African American: >90 mL/min (ref >90)
GFR, Est Non African American: >90 mL/min (ref >90)
Glucose, Bld: 105 mg/dL — ABNORMAL HIGH (ref 70–99)
Total Bilirubin: 0.3 mg/dL (ref 0.3–1.2)

## 2010-11-01 LAB — HIV-1 RNA QUANT-NO REFLEX-BLD: HIV-1 RNA Quant, Log: <1.3 {Log} (ref <1.30)

## 2010-11-05 ENCOUNTER — Ambulatory Visit: Payer: Self-pay | Admitting: Infectious Diseases

## 2010-11-20 ENCOUNTER — Ambulatory Visit: Payer: Self-pay | Admitting: Infectious Diseases

## 2010-11-26 ENCOUNTER — Ambulatory Visit: Payer: Self-pay | Admitting: Internal Medicine

## 2010-11-27 ENCOUNTER — Ambulatory Visit: Payer: Self-pay | Admitting: Internal Medicine

## 2011-03-28 ENCOUNTER — Other Ambulatory Visit: Payer: Self-pay | Admitting: *Deleted

## 2011-03-28 DIAGNOSIS — B20 Human immunodeficiency virus [HIV] disease: Secondary | ICD-10-CM

## 2011-04-01 ENCOUNTER — Other Ambulatory Visit: Payer: Self-pay

## 2011-04-01 ENCOUNTER — Ambulatory Visit: Payer: Self-pay

## 2011-04-02 ENCOUNTER — Ambulatory Visit: Payer: Self-pay

## 2011-04-02 ENCOUNTER — Other Ambulatory Visit (INDEPENDENT_AMBULATORY_CARE_PROVIDER_SITE_OTHER): Payer: Self-pay

## 2011-04-02 DIAGNOSIS — B20 Human immunodeficiency virus [HIV] disease: Secondary | ICD-10-CM

## 2011-04-02 LAB — CBC WITH DIFFERENTIAL/PLATELET
Basophils Absolute: 0 10*3/uL (ref 0.0–0.1)
HCT: 42.7 % (ref 39.0–52.0)
Hemoglobin: 13.9 g/dL (ref 13.0–17.0)
Lymphocytes Relative: 37 % (ref 12–46)
Monocytes Absolute: 0.2 10*3/uL (ref 0.1–1.0)
Monocytes Relative: 6 % (ref 3–12)
Neutro Abs: 2 10*3/uL (ref 1.7–7.7)
RBC: 4.32 MIL/uL (ref 4.22–5.81)
WBC: 3.7 10*3/uL — ABNORMAL LOW (ref 4.0–10.5)

## 2011-04-03 LAB — COMPREHENSIVE METABOLIC PANEL
AST: 46 U/L — ABNORMAL HIGH (ref 0–37)
Albumin: 4.6 g/dL (ref 3.5–5.2)
BUN: 16 mg/dL (ref 6–23)
Calcium: 9.5 mg/dL (ref 8.4–10.5)
Chloride: 110 mEq/L (ref 96–112)
Potassium: 4.2 mEq/L (ref 3.5–5.3)

## 2011-04-07 LAB — HIV-1 RNA QUANT-NO REFLEX-BLD: HIV-1 RNA Quant, Log: <1.3 {Log} (ref <1.30)

## 2011-04-15 ENCOUNTER — Ambulatory Visit: Payer: Self-pay | Admitting: Internal Medicine

## 2011-04-21 ENCOUNTER — Ambulatory Visit: Payer: Self-pay | Admitting: Internal Medicine

## 2011-04-21 ENCOUNTER — Other Ambulatory Visit: Payer: Self-pay | Admitting: *Deleted

## 2011-04-21 DIAGNOSIS — B20 Human immunodeficiency virus [HIV] disease: Secondary | ICD-10-CM

## 2011-04-21 MED ORDER — EFAVIRENZ-EMTRICITAB-TENOFOVIR 600-200-300 MG PO TABS: 1.0000 | ORAL_TABLET | Freq: Every day | ORAL | Status: DC

## 2011-04-21 NOTE — Telephone Encounter (Signed)
Patient ADAP came through sent Rx to pharmacy. 

## 2011-04-29 ENCOUNTER — Ambulatory Visit: Payer: Self-pay | Admitting: Internal Medicine

## 2011-07-30 IMAGING — CR DG HUMERUS 2V *L*
2 series · 2 of 2 positions shown · non-contrast
Comparison: None.

CLINICAL DATA: [place] the none.

LEFT HUMERUS - 2+ VIEW

[w humerus ap left *]
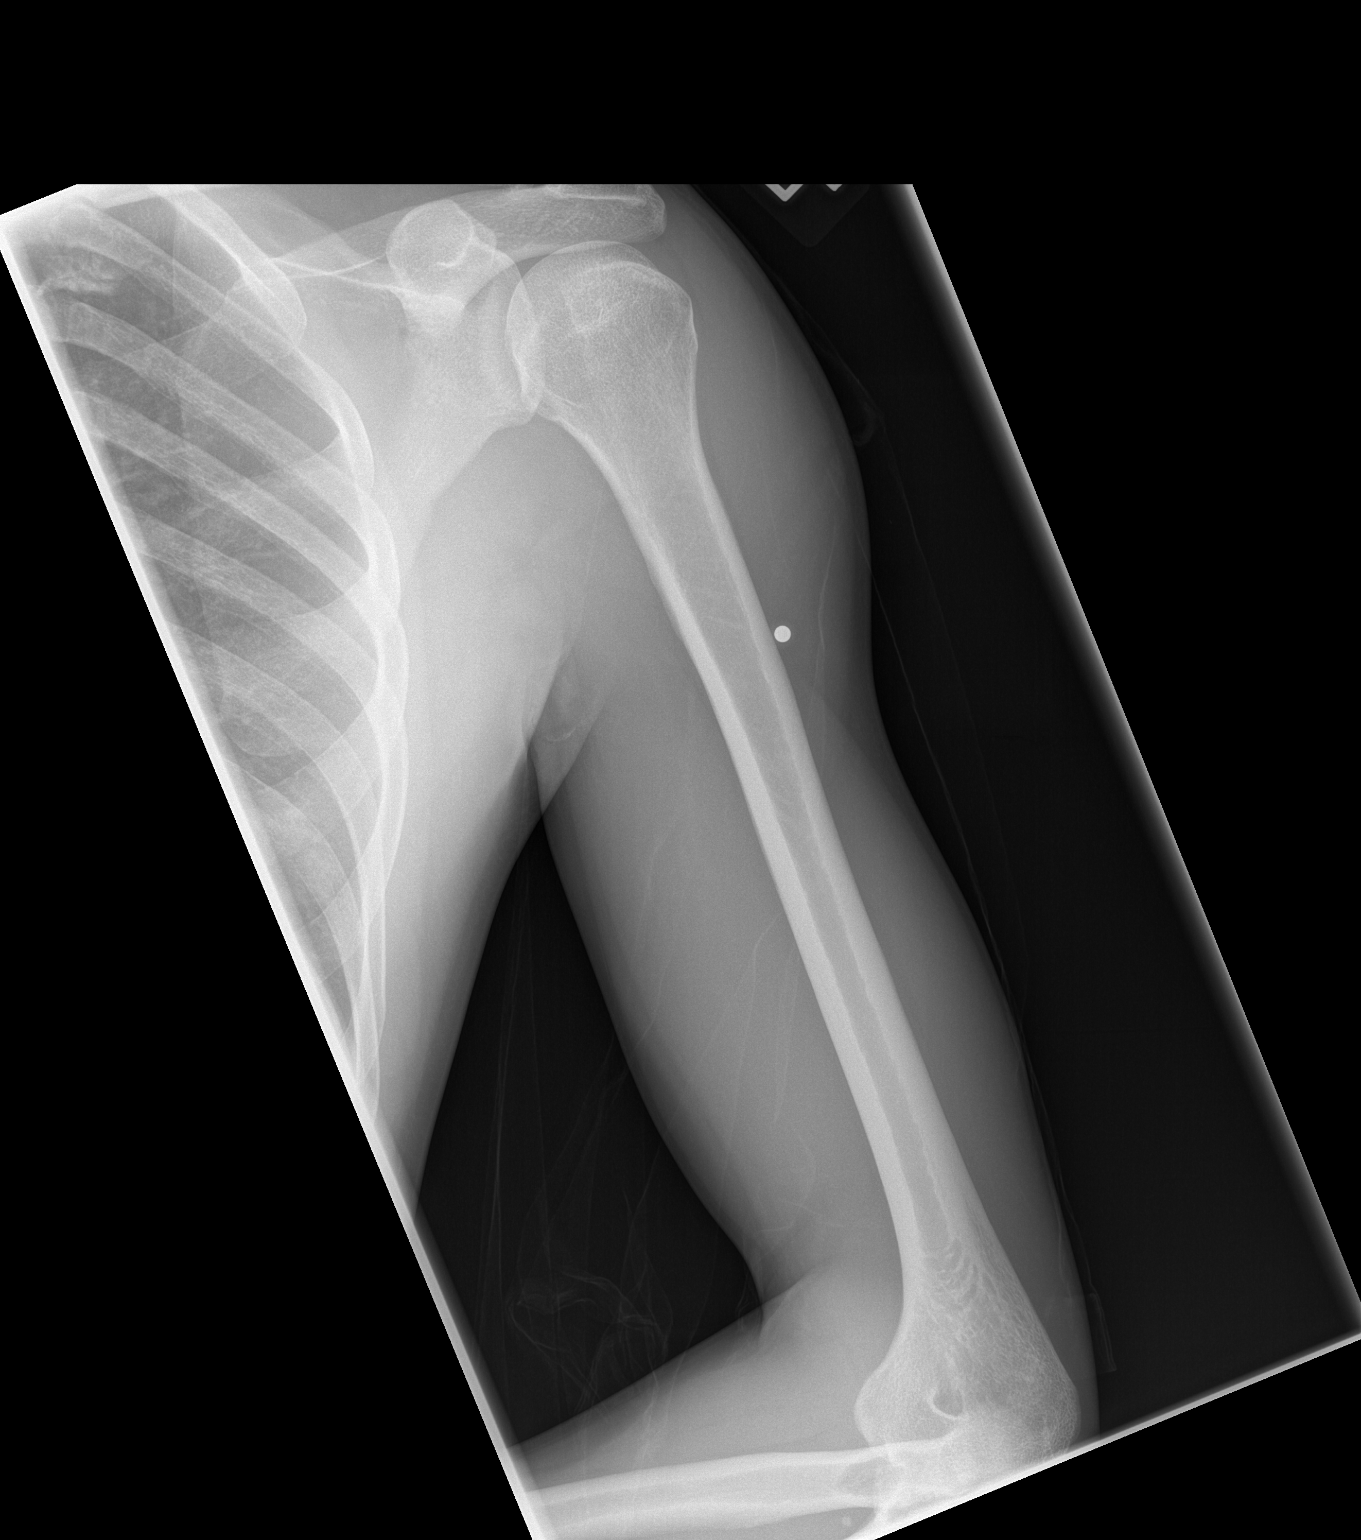

[w humerus lat left *]
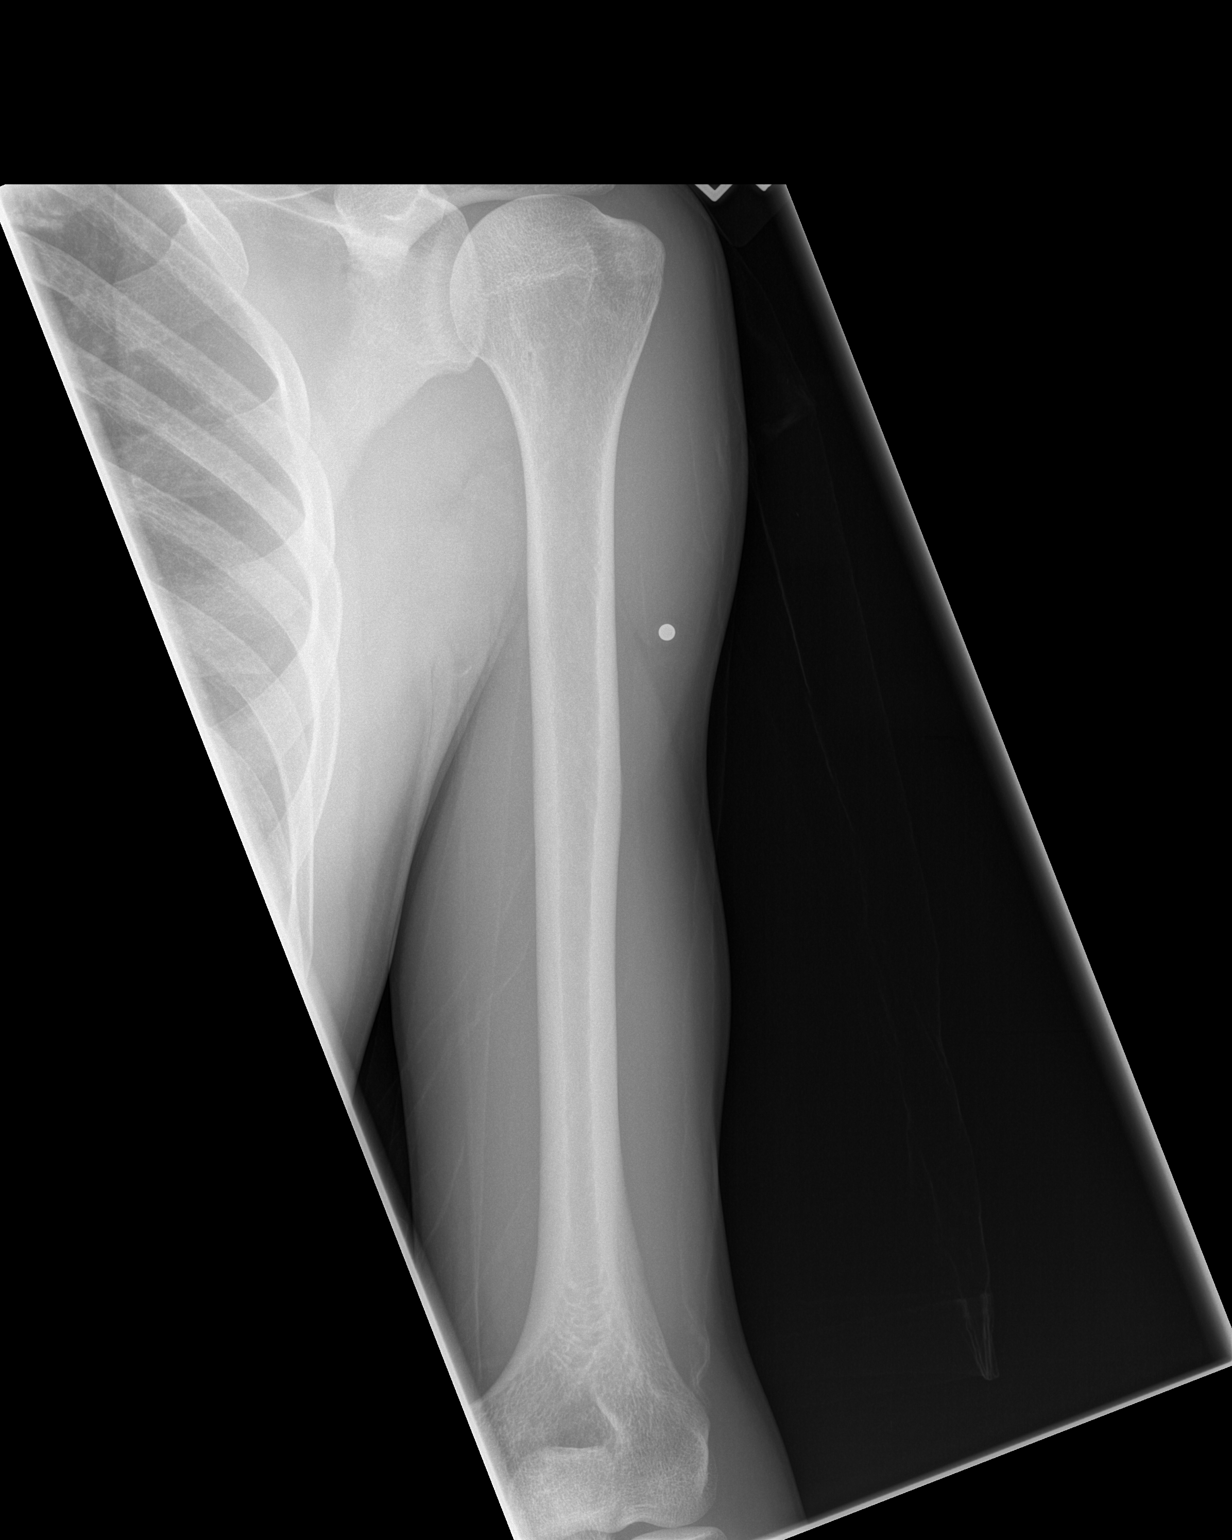

[2 of 2 positions shown; findings below may reference images not displayed]

FINDINGS: Two-view exam the left forearm shows the a round
radiopaque foreign body in the posterolateral soft tissues of the
proximal arm.  No evidence for underlying bony abnormality.
IMPRESSION: The radiopaque foreign body identified in the soft tissues of the
proximal arm.

## 2012-01-23 ENCOUNTER — Other Ambulatory Visit: Payer: Self-pay | Admitting: *Deleted

## 2012-01-23 DIAGNOSIS — B20 Human immunodeficiency virus [HIV] disease: Secondary | ICD-10-CM

## 2012-02-02 ENCOUNTER — Other Ambulatory Visit (INDEPENDENT_AMBULATORY_CARE_PROVIDER_SITE_OTHER): Payer: Self-pay

## 2012-02-02 ENCOUNTER — Ambulatory Visit: Payer: Self-pay

## 2012-02-02 DIAGNOSIS — Z113 Encounter for screening for infections with a predominantly sexual mode of transmission: Secondary | ICD-10-CM

## 2012-02-02 DIAGNOSIS — Z79899 Other long term (current) drug therapy: Secondary | ICD-10-CM

## 2012-02-02 DIAGNOSIS — B20 Human immunodeficiency virus [HIV] disease: Secondary | ICD-10-CM

## 2012-02-02 LAB — CBC WITH DIFFERENTIAL/PLATELET
Basophils Absolute: 0 10*3/uL (ref 0.0–0.1)
HCT: 42.6 % (ref 39.0–52.0)
Hemoglobin: 14.8 g/dL (ref 13.0–17.0)
Lymphocytes Relative: 38 % (ref 12–46)
Lymphs Abs: 1.8 10*3/uL (ref 0.7–4.0)
MCV: 90.3 fL (ref 78.0–100.0)
Neutro Abs: 2.3 10*3/uL (ref 1.7–7.7)
Platelets: 243 10*3/uL (ref 150–400)
RDW: 13.9 % (ref 11.5–15.5)

## 2012-02-02 LAB — LIPID PANEL
LDL Cholesterol: 90 mg/dL (ref 0–99)
Triglycerides: 104 mg/dL (ref <150)
VLDL: 21 mg/dL (ref 0–40)

## 2012-02-03 ENCOUNTER — Other Ambulatory Visit: Payer: Self-pay | Admitting: *Deleted

## 2012-02-03 DIAGNOSIS — B20 Human immunodeficiency virus [HIV] disease: Secondary | ICD-10-CM

## 2012-02-03 LAB — COMPLETE METABOLIC PANEL WITH GFR
ALT: 46 U/L (ref 0–53)
AST: 41 U/L — ABNORMAL HIGH (ref 0–37)
Albumin: 4.4 g/dL (ref 3.5–5.2)
CO2: 21 mEq/L (ref 19–32)
Calcium: 9.5 mg/dL (ref 8.4–10.5)
Chloride: 107 mEq/L (ref 96–112)
Potassium: 4.7 mEq/L (ref 3.5–5.3)
Sodium: 138 mEq/L (ref 135–145)
Total Protein: 7.2 g/dL (ref 6.0–8.3)

## 2012-02-03 LAB — T-HELPER CELL (CD4) - (RCID CLINIC ONLY)
CD4 % Helper T Cell: 33 % (ref 33–55)
CD4 T Cell Abs: 570 uL (ref 400–2700)

## 2012-02-03 LAB — RPR

## 2012-02-03 MED ORDER — EFAVIRENZ-EMTRICITAB-TENOFOVIR 600-200-300 MG PO TABS: 1.0000 | ORAL_TABLET | Freq: Every day | ORAL | Status: AC

## 2012-02-04 LAB — HIV-1 RNA QUANT-NO REFLEX-BLD: HIV 1 RNA Quant: <20 copies/mL (ref <20)

## 2012-02-16 ENCOUNTER — Telehealth: Payer: Self-pay | Admitting: *Deleted

## 2012-02-16 ENCOUNTER — Ambulatory Visit: Payer: Self-pay | Admitting: Internal Medicine

## 2012-02-16 NOTE — Telephone Encounter (Signed)
Spoke with mother who answered patient's home number. Patient had "just stepped out to the store" and she asked if I could leave a message and phone number.  I asked her to please have him call and reschedule his follow up appointment, giving her the clinic's number. Andree Coss, RN

## 2012-02-24 ENCOUNTER — Ambulatory Visit: Payer: Self-pay | Admitting: Internal Medicine

## 2012-02-24 ENCOUNTER — Telehealth: Payer: Self-pay | Admitting: *Deleted

## 2012-02-24 NOTE — Telephone Encounter (Signed)
Called and left voice mail for patient to call the clinic to reschedule appt.  She has several no shows and has not been seen since 2012. She did have a recent lab appointment. Left on voice mail that she needed to reschedule in order for the MD to continue to refill her medications. Wendall Mola

## 2020-12-07 ENCOUNTER — Emergency Department (HOSPITAL_COMMUNITY): Payer: Self-pay

## 2020-12-07 ENCOUNTER — Emergency Department (HOSPITAL_COMMUNITY)
Admission: EM | Admit: 2020-12-07 | Discharge: 2020-12-07 | Disposition: A | Discharge status: Home / Self Care | Payer: Self-pay | Attending: Emergency Medicine | Admitting: Emergency Medicine

## 2020-12-07 ENCOUNTER — Encounter (HOSPITAL_COMMUNITY): Payer: Self-pay

## 2020-12-07 DIAGNOSIS — M545 Low back pain, unspecified: Secondary | ICD-10-CM | POA: Insufficient documentation

## 2020-12-07 DIAGNOSIS — Z21 Asymptomatic human immunodeficiency virus [HIV] infection status: Secondary | ICD-10-CM | POA: Insufficient documentation

## 2020-12-07 DIAGNOSIS — B20 Human immunodeficiency virus [HIV] disease: Secondary | ICD-10-CM

## 2020-12-07 DIAGNOSIS — M5136 Other intervertebral disc degeneration, lumbar region: Secondary | ICD-10-CM

## 2020-12-07 DIAGNOSIS — F17210 Nicotine dependence, cigarettes, uncomplicated: Secondary | ICD-10-CM | POA: Insufficient documentation

## 2020-12-07 HISTORY — DX: Human immunodeficiency virus (HIV) disease: B20

## 2020-12-07 LAB — CBC WITH DIFFERENTIAL/PLATELET
Abs Immature Granulocytes: 0.01 K/uL (ref 0.00–0.07)
Basophils Absolute: 0 K/uL (ref 0.0–0.1)
Basophils Relative: 1 %
Eosinophils Absolute: 0 K/uL (ref 0.0–0.5)
Eosinophils Relative: 2 %
HCT: 46.1 % (ref 39.0–52.0)
Hemoglobin: 15.1 g/dL (ref 13.0–17.0)
Immature Granulocytes: 0 %
Lymphocytes Relative: 45 %
Lymphs Abs: 1.2 K/uL (ref 0.7–4.0)
MCH: 33 pg (ref 26.0–34.0)
MCHC: 32.8 g/dL (ref 30.0–36.0)
MCV: 100.7 fL — ABNORMAL HIGH (ref 80.0–100.0)
Monocytes Absolute: 0.3 K/uL (ref 0.1–1.0)
Monocytes Relative: 12 %
Neutro Abs: 1.1 K/uL — ABNORMAL LOW (ref 1.7–7.7)
Neutrophils Relative %: 40 %
Platelets: 248 K/uL (ref 150–400)
RBC: 4.58 MIL/uL (ref 4.22–5.81)
RDW: 12.4 % (ref 11.5–15.5)
WBC: 2.6 K/uL — ABNORMAL LOW (ref 4.0–10.5)
nRBC: 0 % (ref 0.0–0.2)

## 2020-12-07 LAB — COMPREHENSIVE METABOLIC PANEL WITH GFR
ALT: 20 U/L (ref 0–44)
AST: 29 U/L (ref 15–41)
Albumin: 3.7 g/dL (ref 3.5–5.0)
Alkaline Phosphatase: 91 U/L (ref 38–126)
Anion gap: 13 (ref 5–15)
BUN: 12 mg/dL (ref 6–20)
CO2: 20 mmol/L — ABNORMAL LOW (ref 22–32)
Calcium: 8.9 mg/dL (ref 8.9–10.3)
Chloride: 105 mmol/L (ref 98–111)
Creatinine, Ser: 0.91 mg/dL (ref 0.61–1.24)
GFR, Estimated: >60 mL/min
Glucose, Bld: 93 mg/dL (ref 70–99)
Potassium: 4.1 mmol/L (ref 3.5–5.1)
Sodium: 138 mmol/L (ref 135–145)
Total Bilirubin: 0.2 mg/dL — ABNORMAL LOW (ref 0.3–1.2)
Total Protein: 7.2 g/dL (ref 6.5–8.1)

## 2020-12-07 MED ORDER — KETOROLAC TROMETHAMINE 15 MG/ML IJ SOLN
15.0000 mg | Freq: Once | INTRAMUSCULAR | Status: DC
  Filled 2020-12-07: qty 1

## 2020-12-07 MED ORDER — NAPROXEN 500 MG PO TABS: 500.0000 mg | ORAL_TABLET | Freq: Two times a day (BID) | ORAL | 0 refills | Status: AC

## 2020-12-07 MED ORDER — KETOROLAC TROMETHAMINE 15 MG/ML IJ SOLN
15.0000 mg | Freq: Once | INTRAMUSCULAR | Status: AC
  Administered 2020-12-07: 15 mg via INTRAMUSCULAR

## 2020-12-07 MED ORDER — LIDOCAINE 5 % EX PTCH
1.0000 | MEDICATED_PATCH | CUTANEOUS | Status: DC
  Administered 2020-12-07: 1 via TRANSDERMAL
  Filled 2020-12-07: qty 1

## 2020-12-07 NOTE — ED Notes (Signed)
Able to stand up and walked a few steps in the room. Observed with a steady gait. NO injury/trauma noted. Pt is alert and oriented x 4. Reports being homeless and has been sleeping on concrete ground. Reports problems in the past with back pain with no specific diagnosis. Denies numbness/tingling to all four extremities. Reports lower back pain. Will continue to monitor.

## 2020-12-07 NOTE — ED Provider Notes (Signed)
[facility] EMERGENCY DEPARTMENT Provider Note   CSN: [id number] Arrival date & time: 12/07/20  0109     History Chief Complaint  Patient presents with   Back Pain    Erik Espinoza is a 45 y.o. male presenting for evaluation of low back pain.  Patient states when he woke up this morning he had severe mid low back pain.  He has never had pain like this before or in this location.  He denies fall, trauma, or injury.  Pain is constant, worse with movement palpation, nothing is better.  He has not taken anything including Tylenol ibuprofen.  Pain is a stabbing pain.  Does not radiate down his leg.  Denies fevers, chills, nausea, vomiting, urinary symptoms, normal bowel movements, loss of bowel bladder control.  No new numbness or tingling.  He has a history of HIV for which he does not take medication, and has not in many years.  He is interested in getting reestablished with the ID clinic.  No other known medical problems.  Denies history of cancer.  Denies history of IVDU.  HPI     Past Medical History:  Diagnosis Date   HIV (human immunodeficiency virus infection) [facility])     Patient Active Problem List   Diagnosis Date Noted   KNEE PAIN, RIGHT 11/07/2009   HIV DISEASE 03/15/2009    History reviewed. No pertinent surgical history.     No family history on file.  Social History   Tobacco Use   Smoking status: Every Day    Packs/day: 0.25    Types: Cigarettes   Smokeless tobacco: Never  Substance Use Topics   Alcohol use: Not Currently    Alcohol/week: 6.0 standard drinks    Types: 6 Cans of beer per week   Drug use: No    Home Medications Prior to Admission medications   Medication Sig Start Date End Date Taking? Authorizing Provider  naproxen (NAPROSYN) 500 MG tablet Take 1 tablet (500 mg total) by mouth 2 (two) times daily with a meal. 12/07/20  Yes Trystin Hargrove, PA-C  efavirenz-emtricitabine-tenofovir (ATRIPLA) 600-200-300 MG per  tablet Take 1 tablet by mouth at bedtime. Patient not taking: Reported on 12/07/2020 02/03/12   Gardiner Barefoot, MD    Allergies    Patient has no known allergies.  Review of Systems   Review of Systems  Musculoskeletal:  Positive for back pain.  Allergic/Immunologic: Positive for immunocompromised state.  All other systems reviewed and are negative.  Physical Exam Updated Vital Signs BP (!) 137/91 (BP Location: Right Arm)   Pulse 87   Temp 98 F (36.7 C) (Oral)   Resp 15   Ht 5\' 6"  (1.676 m)   Wt 59 kg   SpO2 100%   BMI 20.98 kg/m   Physical Exam Vitals and nursing note reviewed.  Constitutional:      General: He is not in acute distress.    Appearance: Normal appearance.  HENT:     Head: Normocephalic and atraumatic.  Eyes:     Conjunctiva/sclera: Conjunctivae normal.     Pupils: Pupils are equal, round, and reactive to light.  Cardiovascular:     Rate and Rhythm: Normal rate and regular rhythm.     Pulses: Normal pulses.  Pulmonary:     Effort: Pulmonary effort is normal. No respiratory distress.     Breath sounds: Normal breath sounds. No wheezing.     Comments: Speaking in full sentences.  Clear lung sounds in  all fields. Abdominal:     General: There is no distension.     Palpations: Abdomen is soft.     Tenderness: There is no abdominal tenderness.  Musculoskeletal:        General: Normal range of motion.     Cervical back: Normal range of motion and neck supple.     Comments: Tenderness palpation of low lumbar spine over midline.  Minimal perivertebral tenderness palpation of the musculature.  No step-offs or deformities.  No erythema or warmth.  Strength equal bilaterally.  Increased pain with straight leg raise, but patient able to perform.  No saddle anesthesia.  Patellar reflexes equal bilaterally.  No tenderness palpation of mid or upper back.  Skin:    General: Skin is warm and dry.     Capillary Refill: Capillary refill takes less than 2 seconds.   Neurological:     Mental Status: He is alert and oriented to person, place, and time.  Psychiatric:        Mood and Affect: Mood and affect normal.        Speech: Speech normal.        Behavior: Behavior normal.    ED Results / Procedures / Treatments   Labs (all labs ordered are listed, but only abnormal results are displayed) Labs Reviewed  CBC WITH DIFFERENTIAL/PLATELET - Abnormal; Notable for the following components:      Result Value   WBC 2.6 (*)    MCV 100.7 (*)    Neutro Abs 1.1 (*)    All other components within normal limits  COMPREHENSIVE METABOLIC PANEL - Abnormal; Notable for the following components:   CO2 20 (*)    Total Bilirubin 0.2 (*)    All other components within normal limits  T-HELPER CELLS (CD4) COUNT (NOT AT [facility])    EKG None  Radiology CT Lumbar Spine Wo Contrast  Result Date: 12/07/2020 CLINICAL DATA:  Low back pain.  Increased fracture risk EXAM: CT LUMBAR SPINE WITHOUT CONTRAST TECHNIQUE: Multidetector CT imaging of the lumbar spine was performed without intravenous contrast administration. Multiplanar CT image reconstructions were also generated. COMPARISON:  None. FINDINGS: Segmentation: Normal segmentation.  Five lumbar segments. Alignment: Normal Vertebrae: Negative for fracture or mass Paraspinal and other soft tissues: Negative for paraspinous mass or adenopathy. Disc levels: T12-L1: Negative L1-2: Negative L2-3: Mild disc bulging.  Negative for stenosis L3-4: Mild disc bulging.  Negative for stenosis L4-5: Diffuse disc bulging, asymmetric on the right. Possible broad-based right-sided disc protrusion contributing to right foraminal stenosis. Possible right L4 nerve root impingement in the foramen. Mild facet degeneration and mild to moderate spinal stenosis. L5-S1: Small central disc protrusion and mild facet degeneration. No significant neural impingement. IMPRESSION: 1. Mild to moderate spinal stenosis L4-5. Disc is diffusely bulging asymmetric  to the right. Possible right sided disc protrusion with right L4 nerve root impingement in the foramen. 2. Negative for lumbar fracture. Electronically Signed   By: Marlan Palau M.D.   On: 12/07/2020 10:04    Procedures Procedures   Medications Ordered in ED Medications  lidocaine (LIDODERM) 5 % 1 patch (1 patch Transdermal Patch Applied 12/07/20 0940)  ketorolac (TORADOL) 15 MG/ML injection 15 mg (15 mg Intramuscular Given 12/07/20 0240)    ED Course  I have reviewed the triage vital signs and the nursing notes.  Pertinent labs & imaging results that were available during my care of the patient were reviewed by me and considered in my medical decision making (see chart  for details).    MDM Rules/Calculators/A&P                           Patient presenting for evaluation of low back pain.  On exam, patient appears nontoxic.  No neurologic deficits.  While patient is well-appearing, and sxs are likely MSK, as pain is atraumatic and pt has uncontrolled HIV, will obtain labs and CT imaging. However due to my vry low pre-test probability for lesion, infection, or compression, if CT is negative I do not feel pt needs emergent MRI.   CT significant degenerative changes and mild disc bulging, however no findings that would require emergent interventions.  On reevaluation, patient reports improvement of symptoms.  Blood work is overall reassuring, there is some leukopenia which is likely due to his HIV.  However electrolytes are stable, hemoglobin stable.  CD4 count sent and is pending.  Discussed findings with patient.  Discussed importance of close follow-up with ID.  Social work team asked to help assist patient with this.  Discussed continued symptomatic management with anti-inflammatories.  At this time, patient appears safe for discharge.  Return precautions given.  Patient states he understands and agrees to plan.   Final Clinical Impression(s) / ED Diagnoses Final diagnoses:  Acute  midline low back pain without sciatica  Bulging lumbar disc  History of HIV infection (HCC)    Rx / DC Orders ED Discharge Orders          Ordered    naproxen (NAPROSYN) 500 MG tablet  2 times daily with meals        12/07/20 1115             Keneth Borg, PA-C 12/07/20 1120    Derwood Kaplan, MD 12/11/20 [phone]

## 2020-12-07 NOTE — ED Triage Notes (Signed)
BIB [facility] pt c/o lower back pain started this AM. Denies trauma. Pt A &O x 4.   Hx homelessness, Etoh abuse.

## 2020-12-07 NOTE — Discharge Instructions (Addendum)
Take naproxen 2 times a day with meals.  Do not take other anti-inflammatories at the same time (Advil, Motrin, ibuprofen, Aleve). You may supplement with Tylenol if you need further pain control. Use muscle creams (bengay, icy hot, salonpas) as needed for pain.  Return to the ER if you develop high fevers, numbness, loss of bowel or bladder control, or any new or concerning symptoms.   I recommend you follow-up with the infectious disease clinic listed below to set up a follow-up appointment.  I have asked our social work team to reach out to you to assist with this.

## 2020-12-07 NOTE — ED Notes (Signed)
Taken to CT by transporter at this time.

## 2021-11-27 IMAGING — CT CT L SPINE W/O CM
3 series · 12 of 33 positions shown, 14 images · non-contrast
Comparison: None.

CLINICAL DATA: Low back pain.  Increased fracture risk

EXAM:
CT LUMBAR SPINE WITHOUT CONTRAST
TECHNIQUE: Multidetector CT imaging of the lumbar spine was performed without
intravenous contrast administration. Multiplanar CT image
reconstructions were also generated.

[Series 3: l-spine 2.0 st · axial · 0.27mm/px · z∈[+953,+1129]mm · 4 of 128 slices shown, 5 images]
[im 20/128  soft-tissue]
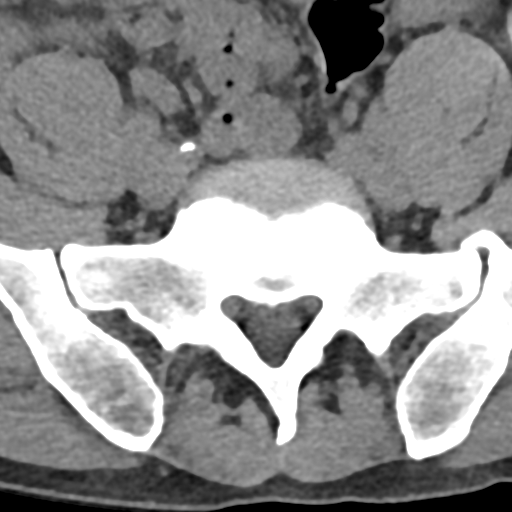
[im 20/128  bone]
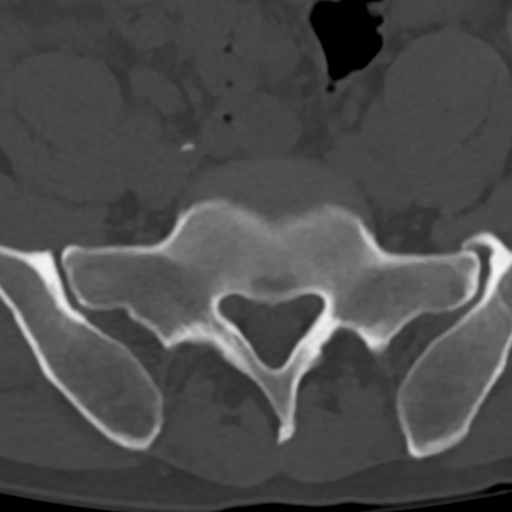
[im 49/128  bone]
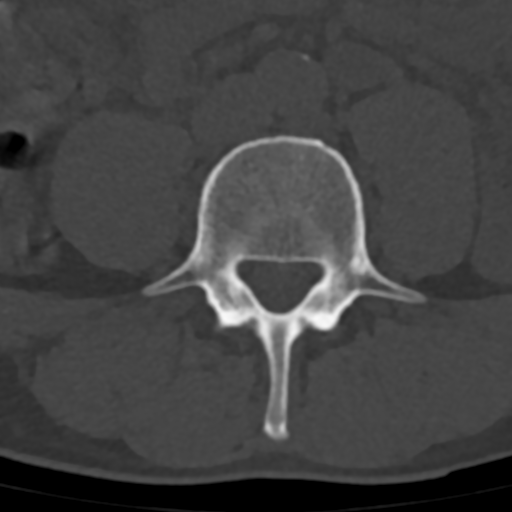
[im 79/128  bone]
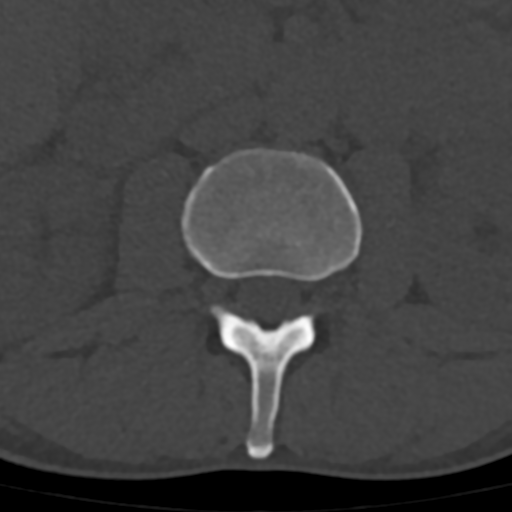
[im 108/128  bone]
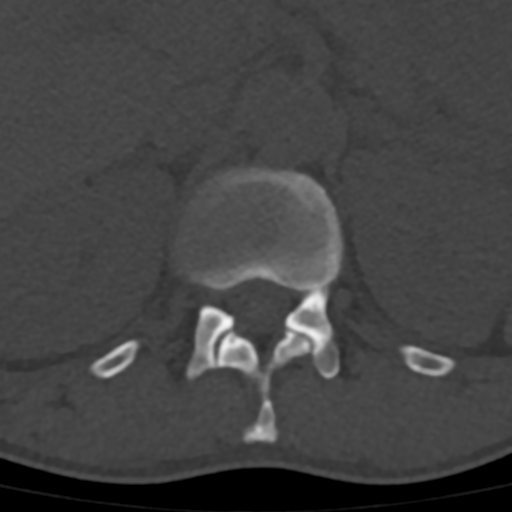

[Series 9: l-spine 2.0 cor · coronal · 0.37mm/px · 3 of 61 slices shown]
[im 13/61  bone]
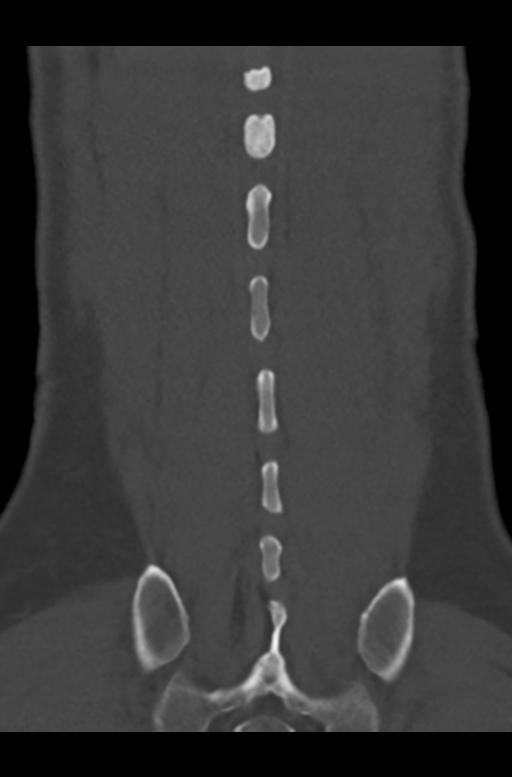
[im 25/61  bone]
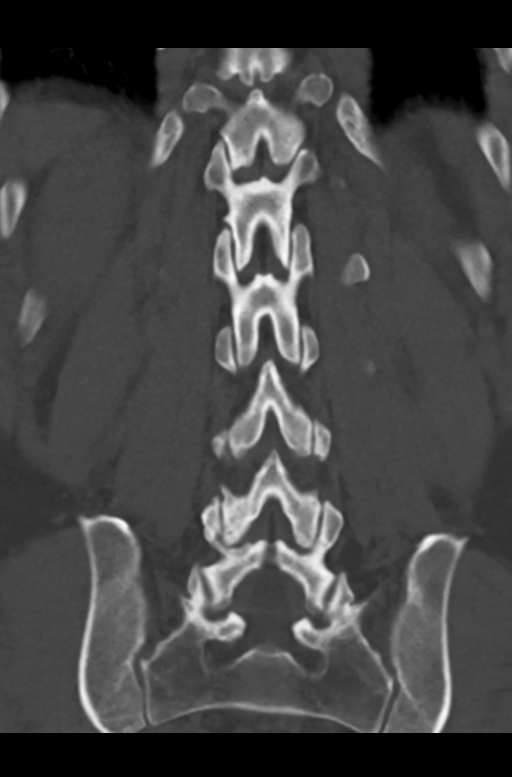
[im 37/61  bone]
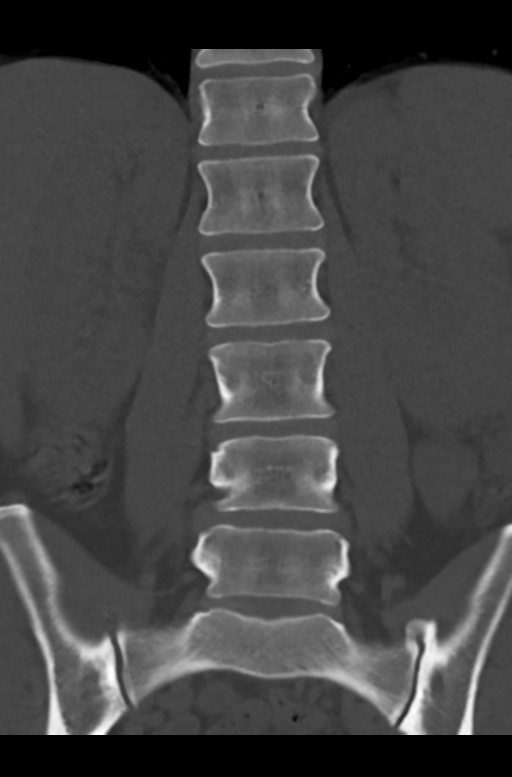

[Series 10: l-spine 2.0 sag · sagittal · 0.37mm/px · 5 of 61 slices shown, 6 images]
[im 21/61  bone]
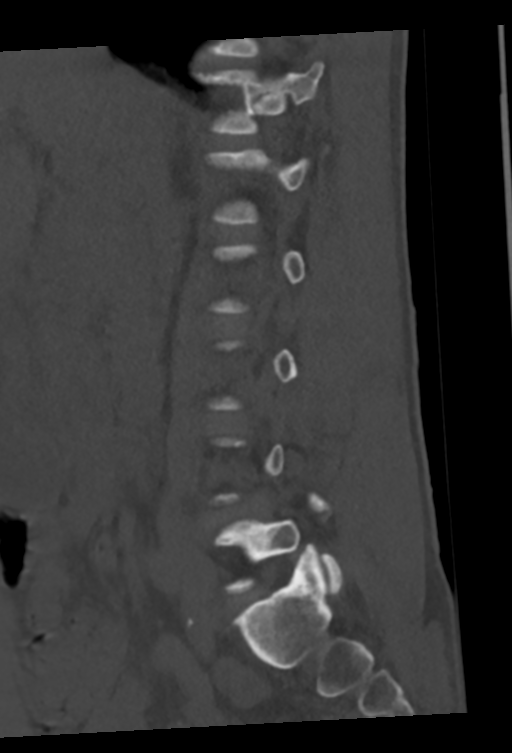
[im 26/61  bone]
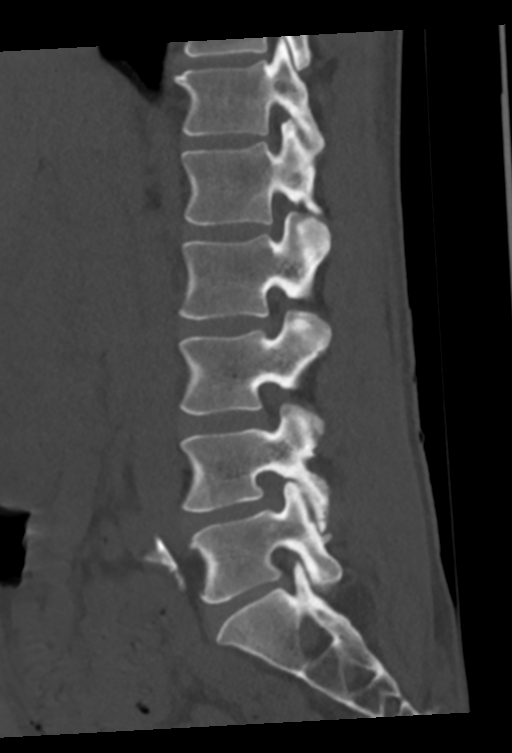
[im 31/61  soft-tissue]
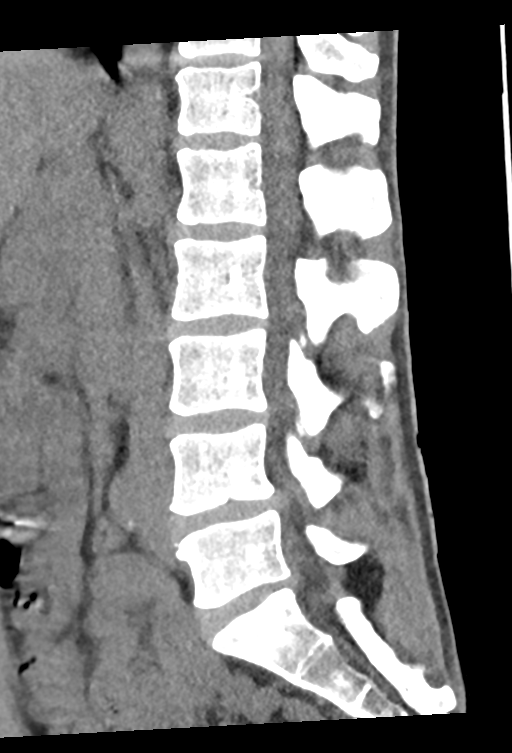
[im 31/61  bone]
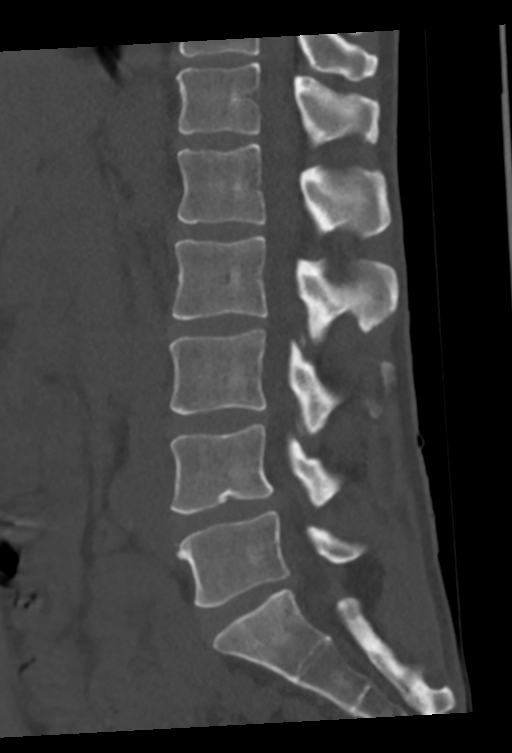
[im 36/61  bone]
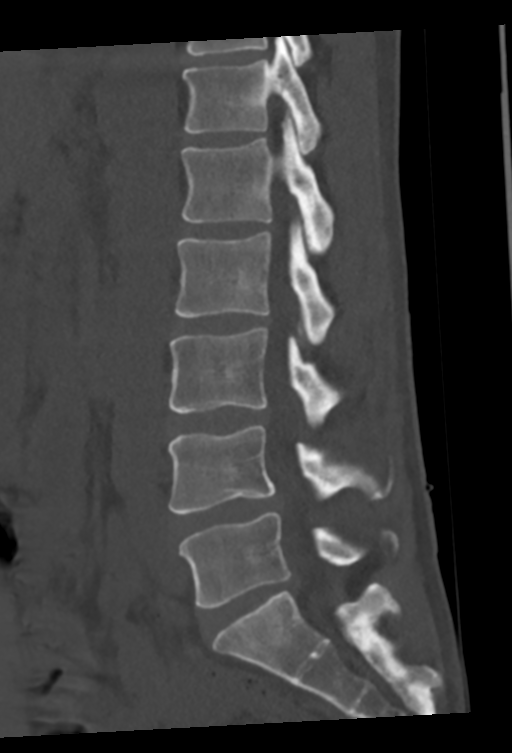
[im 41/61  bone]
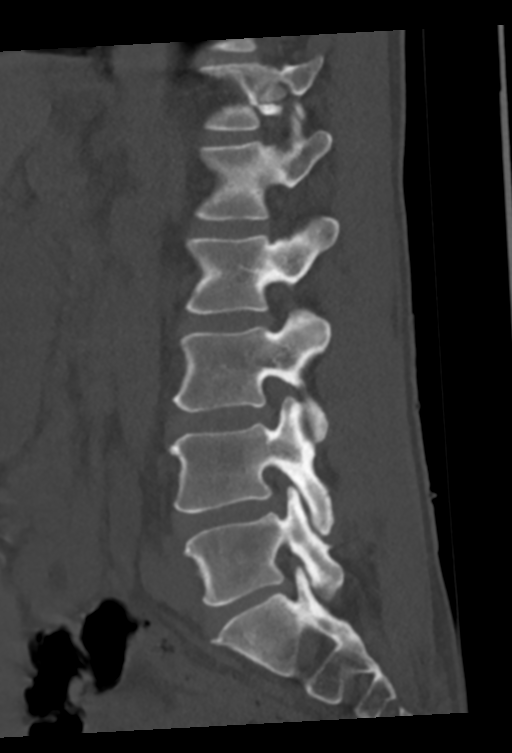

[12 of 33 positions shown; findings below may reference images not displayed]

FINDINGS: Segmentation: Normal segmentation.  Five lumbar segments.

Alignment: Normal

Vertebrae: Negative for fracture or mass

Paraspinal and other soft tissues: Negative for paraspinous mass or
adenopathy.

Disc levels: T12-L1: Negative

L1-2: Negative

L2-3: Mild disc bulging.  Negative for stenosis

L3-4: Mild disc bulging.  Negative for stenosis

L4-5: Diffuse disc bulging, asymmetric on the right. Possible
broad-based right-sided disc protrusion contributing to right
foraminal stenosis. Possible right L4 nerve root impingement in the
foramen. Mild facet degeneration and mild to moderate spinal
stenosis.

L5-S1: Small central disc protrusion and mild facet degeneration. No
significant neural impingement.
IMPRESSION: 1. Mild to moderate spinal stenosis L4-5. Disc is diffusely bulging
asymmetric to the right. Possible right sided disc protrusion with
right L4 nerve root impingement in the foramen.
2. Negative for lumbar fracture.
